# Patient Record
Sex: Female | Born: 1965 | Race: White | Hispanic: No | Marital: Single | Smoking: Never smoker
Health system: Southern US, Community
[De-identification: ages and names within clinical notes are randomized; demographics above are authoritative.]

## PROBLEM LIST (undated history)

## (undated) DIAGNOSIS — K5790 Diverticulosis of intestine, part unspecified, without perforation or abscess without bleeding: Secondary | ICD-10-CM

## (undated) DIAGNOSIS — Z820 Family history of epilepsy and other diseases of the nervous system: Secondary | ICD-10-CM

## (undated) DIAGNOSIS — I10 Essential (primary) hypertension: Secondary | ICD-10-CM

## (undated) DIAGNOSIS — E7800 Pure hypercholesterolemia, unspecified: Secondary | ICD-10-CM

## (undated) HISTORY — DX: Family history of epilepsy and other diseases of the nervous system: Z82.0

## (undated) HISTORY — PX: ABDOMINAL HYSTERECTOMY: SHX81

## (undated) HISTORY — PX: FOOT SURGERY: SHX648

---

## 2013-03-05 ENCOUNTER — Encounter: Payer: Self-pay | Admitting: Podiatrist

## 2013-03-05 ENCOUNTER — Ambulatory Visit (INDEPENDENT_AMBULATORY_CARE_PROVIDER_SITE_OTHER): Payer: BC Managed Care – PPO | Admitting: Podiatrist

## 2013-03-05 ENCOUNTER — Ambulatory Visit (INDEPENDENT_AMBULATORY_CARE_PROVIDER_SITE_OTHER): Payer: BC Managed Care – PPO

## 2013-03-05 VITALS — BP 140/96 | HR 88 | Resp 18

## 2013-03-05 DIAGNOSIS — M722 Plantar fascial fibromatosis: Secondary | ICD-10-CM

## 2013-03-05 MED ORDER — TRIAMCINOLONE ACETONIDE 40 MG/ML IJ SUSP
20.0000 mg | Freq: Once | INTRAMUSCULAR | Status: AC
  Administered 2013-03-05: 20 mg

## 2013-03-05 NOTE — Progress Notes (Signed)
   Subjective:    Patient ID: Alicia Case, female    DOB: 1965-06-11, 48 y.o.   MRN: [id number]  HPI My right heel has been acting up since sept 21, 2014 and I have rolled ice bottle and stretch and sore and tender and burns and worse after I sit and getting up in the morning    Review of Systems  HENT: Positive for sinus pressure.   Eyes: Positive for redness and itching.  Musculoskeletal: Positive for back pain and gait problem.  Allergic/Immunologic: Positive for environmental allergies.  Neurological: Positive for headaches.  All other systems reviewed and are negative.       Objective:   Physical Exam GENERAL APPEARANCE: Alert, conversant. Appropriately groomed. No acute distress.  VASCULAR: Pedal pulses palpable at 2/4 DP and PT bilateral.  Capillary refill time is immediate to all digits,  Proximal to distal cooling it warm to warm.  Digital hair growth is present bilateral  NEUROLOGIC: sensation is intact epicritically and protectively to 5.07 monofilament at 5/5 sites bilateral.  Light touch is intact bilateral, vibratory sensation intact bilateral, achilles tendon reflex is intact bilateral.  MUSCULOSKELETAL: acceptable muscle strength, tone and stability bilateral.  Pain on palpation plantar medial side of the right heel is present.  Pain at insertion of plantar fascia on medial calcaneal tubercle is elicited.  Mild discomfort in midsubstance region of plantar fascia is palpated. DERMATOLOGIC: skin color, texture, and turger are within normal limits.      xrays show inferior calcaneal spurring. Increased density along the plantar fascia is also present. Right foot Assessment & Plan:  Plantar fasciitis right Plan: Injected the right foot with Kenalog and Marcaine mixture in the area of maximal tenderness under sterile technique and without complication. Removable plantar fascial strapping was applied. She was given instructions for stretching and I will see her back in 3  weeks. A second injection may be necessary. Also will check her orthotic coverage benefits and notify her at the next visit as well.

## 2013-03-05 NOTE — Patient Instructions (Signed)

## 2013-03-05 NOTE — Progress Notes (Deleted)
Plant. Fasc-- inject-- taping-- 3 weeks back

## 2013-04-02 ENCOUNTER — Ambulatory Visit: Payer: BC Managed Care – PPO | Admitting: Podiatrist

## 2013-04-11 ENCOUNTER — Ambulatory Visit: Payer: BC Managed Care – PPO | Admitting: Podiatrist

## 2013-05-02 ENCOUNTER — Ambulatory Visit: Payer: BC Managed Care – PPO | Admitting: Podiatrist

## 2013-05-16 ENCOUNTER — Ambulatory Visit (INDEPENDENT_AMBULATORY_CARE_PROVIDER_SITE_OTHER): Payer: BC Managed Care – PPO | Admitting: Podiatrist

## 2013-05-16 ENCOUNTER — Encounter: Payer: Self-pay | Admitting: Podiatrist

## 2013-05-16 VITALS — BP 135/92 | HR 67 | Resp 18

## 2013-05-16 DIAGNOSIS — M722 Plantar fascial fibromatosis: Secondary | ICD-10-CM

## 2013-05-16 NOTE — Patient Instructions (Signed)

## 2013-05-16 NOTE — Progress Notes (Signed)
My right heel is starting to hurt again and was better before  Patient presents for follow up of plantar fasciitis right foot.  She relates it did feel better after the injection but it is now uncomfortble again.    Physical Exam  GENERAL APPEARANCE: Alert, conversant. Appropriately groomed. No acute distress.  VASCULAR: Pedal pulses palpable at 2/4 DP and PT bilateral. Capillary refill time is immediate to all digits, Proximal to distal cooling it warm to warm. Digital hair growth is present bilateral  NEUROLOGIC: sensation is intact epicritically and protectively to 5.07 monofilament at 5/5 sites bilateral. Light touch is intact bilateral, vibratory sensation intact bilateral, achilles tendon reflex is intact bilateral.  MUSCULOSKELETAL: acceptable muscle strength, tone and stability bilateral. Pain on palpation plantar medial side of the right heel is present. Pain at insertion of plantar fascia on medial calcaneal tubercle is elicited. Mild discomfort in midsubstance region of plantar fascia is palpated.  DERMATOLOGIC: skin color, texture, and turger are within normal limits.   Assessment & Plan:   Plantar fasciitis right  Plan: Injected the right foot with Kenalog and Marcaine mixture in the area of maximal tenderness under sterile technique and without complication for injection 2. Removable plantar fascial strapping was applied. Discussed the positive benefits of orthotics for further treatment of plantar fasciitis.

## 2013-06-06 ENCOUNTER — Other Ambulatory Visit: Payer: BC Managed Care – PPO

## 2013-08-04 ENCOUNTER — Ambulatory Visit (INDEPENDENT_AMBULATORY_CARE_PROVIDER_SITE_OTHER): Payer: BC Managed Care – PPO | Admitting: *Deleted

## 2013-08-04 DIAGNOSIS — M722 Plantar fascial fibromatosis: Secondary | ICD-10-CM

## 2013-08-04 NOTE — Patient Instructions (Signed)

## 2013-08-04 NOTE — Progress Notes (Signed)
   Subjective:    Patient ID: Alicia Case, female    DOB: 1965/05/10, 48 y.o.   MRN: [id number]  HPI  PICK UP ORTHOTICS AND GIVEN INSTRUCTION.    Review of Systems     Objective:   Physical Exam        Assessment & Plan:

## 2013-09-04 DIAGNOSIS — G43019 Migraine without aura, intractable, without status migrainosus: Secondary | ICD-10-CM | POA: Insufficient documentation

## 2013-09-04 DIAGNOSIS — I10 Essential (primary) hypertension: Secondary | ICD-10-CM | POA: Insufficient documentation

## 2013-09-04 DIAGNOSIS — F331 Major depressive disorder, recurrent, moderate: Secondary | ICD-10-CM | POA: Insufficient documentation

## 2013-09-18 DIAGNOSIS — E559 Vitamin D deficiency, unspecified: Secondary | ICD-10-CM | POA: Insufficient documentation

## 2014-07-13 DIAGNOSIS — F411 Generalized anxiety disorder: Secondary | ICD-10-CM | POA: Insufficient documentation

## 2014-07-13 DIAGNOSIS — Z23 Encounter for immunization: Secondary | ICD-10-CM | POA: Insufficient documentation

## 2014-12-01 ENCOUNTER — Ambulatory Visit (INDEPENDENT_AMBULATORY_CARE_PROVIDER_SITE_OTHER): Payer: Worker's Compensation | Admitting: Family Medicine

## 2014-12-01 ENCOUNTER — Encounter: Payer: Self-pay | Admitting: Family Medicine

## 2014-12-01 VITALS — BP 130/84 | HR 80 | Temp 97.6°F | Ht 65.0 in | Wt 193.0 lb

## 2014-12-01 DIAGNOSIS — S51851A Open bite of right forearm, initial encounter: Secondary | ICD-10-CM | POA: Diagnosis not present

## 2014-12-01 DIAGNOSIS — Z23 Encounter for immunization: Secondary | ICD-10-CM | POA: Diagnosis not present

## 2014-12-01 MED ORDER — CEPHALEXIN 500 MG PO CAPS: 500.0000 mg | ORAL_CAPSULE | Freq: Three times a day (TID) | ORAL | Status: DC

## 2014-12-01 MED ORDER — MUPIROCIN 2 % EX OINT: 1.0000 "application " | TOPICAL_OINTMENT | Freq: Two times a day (BID) | CUTANEOUS | Status: AC

## 2014-12-01 NOTE — Patient Instructions (Signed)
Take antibiotic as directed and until completed Wash thoroughly with warm soap and water Cleanse with peroxide Apply mupirocin ointment to the wound Call sooner if any redness occurs or if it appears to be moving up your arm Return to clinic in one week for follow-up
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
## 2014-12-08 ENCOUNTER — Encounter: Payer: Self-pay | Admitting: Family Medicine

## 2014-12-08 ENCOUNTER — Ambulatory Visit (INDEPENDENT_AMBULATORY_CARE_PROVIDER_SITE_OTHER): Payer: Worker's Compensation | Admitting: Family Medicine

## 2014-12-08 VITALS — BP 126/72 | HR 85 | Temp 98.8°F | Ht 65.0 in | Wt 193.0 lb

## 2014-12-08 DIAGNOSIS — R222 Localized swelling, mass and lump, trunk: Secondary | ICD-10-CM | POA: Diagnosis not present

## 2014-12-08 DIAGNOSIS — M79622 Pain in left upper arm: Secondary | ICD-10-CM | POA: Diagnosis not present

## 2014-12-08 DIAGNOSIS — M25512 Pain in left shoulder: Secondary | ICD-10-CM | POA: Diagnosis not present

## 2014-12-08 DIAGNOSIS — S51851D Open bite of right forearm, subsequent encounter: Secondary | ICD-10-CM | POA: Diagnosis not present

## 2014-12-08 DIAGNOSIS — R2230 Localized swelling, mass and lump, unspecified upper limb: Secondary | ICD-10-CM

## 2014-12-08 MED ORDER — DOXYCYCLINE HYCLATE 100 MG PO TABS: 100.0000 mg | ORAL_TABLET | Freq: Two times a day (BID) | ORAL | Status: DC
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
## 2014-12-08 NOTE — Patient Instructions (Signed)
Follow up in 2 weeks Start the Doxy 100 mg after completing the Keflex

## 2014-12-23 ENCOUNTER — Encounter: Payer: Self-pay | Admitting: Family Medicine

## 2014-12-23 ENCOUNTER — Ambulatory Visit (INDEPENDENT_AMBULATORY_CARE_PROVIDER_SITE_OTHER): Payer: Worker's Compensation | Admitting: Family Medicine

## 2014-12-23 VITALS — BP 126/83 | HR 78 | Temp 97.3°F | Ht 65.0 in | Wt 194.0 lb

## 2014-12-23 DIAGNOSIS — S51851D Open bite of right forearm, subsequent encounter: Secondary | ICD-10-CM | POA: Diagnosis not present
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
## 2015-08-31 DIAGNOSIS — E785 Hyperlipidemia, unspecified: Secondary | ICD-10-CM | POA: Insufficient documentation

## 2015-10-21 DIAGNOSIS — H6123 Impacted cerumen, bilateral: Secondary | ICD-10-CM | POA: Insufficient documentation

## 2015-10-21 DIAGNOSIS — J0100 Acute maxillary sinusitis, unspecified: Secondary | ICD-10-CM | POA: Insufficient documentation

## 2016-10-23 DIAGNOSIS — E669 Obesity, unspecified: Secondary | ICD-10-CM | POA: Insufficient documentation

## 2017-04-26 DIAGNOSIS — J302 Other seasonal allergic rhinitis: Secondary | ICD-10-CM | POA: Insufficient documentation

## 2017-04-26 DIAGNOSIS — Z1211 Encounter for screening for malignant neoplasm of colon: Secondary | ICD-10-CM | POA: Insufficient documentation

## 2017-05-15 DIAGNOSIS — H9012 Conductive hearing loss, unilateral, left ear, with unrestricted hearing on the contralateral side: Secondary | ICD-10-CM | POA: Insufficient documentation

## 2018-03-07 DIAGNOSIS — L989 Disorder of the skin and subcutaneous tissue, unspecified: Secondary | ICD-10-CM | POA: Insufficient documentation

## 2018-04-23 DIAGNOSIS — B354 Tinea corporis: Secondary | ICD-10-CM | POA: Insufficient documentation

## 2018-07-17 ENCOUNTER — Ambulatory Visit: Payer: BC Managed Care – PPO | Admitting: Podiatry

## 2018-07-17 ENCOUNTER — Other Ambulatory Visit: Payer: Self-pay | Admitting: Podiatry

## 2018-07-17 ENCOUNTER — Ambulatory Visit (INDEPENDENT_AMBULATORY_CARE_PROVIDER_SITE_OTHER): Payer: BC Managed Care – PPO

## 2018-07-17 ENCOUNTER — Encounter: Payer: Self-pay | Admitting: Podiatry

## 2018-07-17 ENCOUNTER — Other Ambulatory Visit: Payer: Self-pay

## 2018-07-17 VITALS — Temp 98.1°F

## 2018-07-17 DIAGNOSIS — M7752 Other enthesopathy of left foot: Secondary | ICD-10-CM | POA: Diagnosis not present

## 2018-07-17 DIAGNOSIS — M79672 Pain in left foot: Secondary | ICD-10-CM

## 2018-07-17 DIAGNOSIS — L600 Ingrowing nail: Secondary | ICD-10-CM | POA: Diagnosis not present

## 2018-07-17 NOTE — Patient Instructions (Signed)
Pre-Operative Instructions  Congratulations, you have decided to take an important step towards improving your quality of life.  You can be assured that the doctors and staff at [facility] will be with you every step of the way.  Here are some important things you should know:  1. Plan to be at the surgery center/hospital at least 1 (one) hour prior to your scheduled time, unless otherwise directed by the surgical center/hospital staff.  You must have a responsible adult accompany you, remain during the surgery and drive you home.  Make sure you have directions to the surgical center/hospital to ensure you arrive on time. 2. If you are having surgery at [facility] or [facility] hospitals, you will need a copy of your medical history and physical form from your family physician within one month prior to the date of surgery. We will give you a form for your primary physician to complete.  3. We make every effort to accommodate the date you request for surgery.  However, there are times where surgery dates or times have to be moved.  We will contact you as soon as possible if a change in schedule is required.   4. No aspirin/ibuprofen for one week before surgery.  If you are on aspirin, any non-steroidal anti-inflammatory medications (Mobic, Aleve, Ibuprofen) should not be taken seven (7) days prior to your surgery.  You make take Tylenol for pain prior to surgery.  5. Medications - If you are taking daily heart and blood pressure medications, seizure, reflux, allergy, asthma, anxiety, pain or diabetes medications, make sure you notify the surgery center/hospital before the day of surgery so they can tell you which medications you should take or avoid the day of surgery. 6. No food or drink after midnight the night before surgery unless directed otherwise by surgical center/hospital staff. 7. No alcoholic beverages 24-hours prior to surgery.  No smoking 24-hours prior or 24-hours after  surgery. 8. Wear loose pants or shorts. They should be loose enough to fit over bandages, boots, and casts. 9. Don't wear slip-on shoes. Sneakers are preferred. 10. Bring your boot with you to the surgery center/hospital.  Also bring crutches or a walker if your physician has prescribed it for you.  If you do not have this equipment, it will be provided for you after surgery. 11. If you have not been contacted by the surgery center/hospital by the day before your surgery, call to confirm the date and time of your surgery. 12. Leave-time from work may vary depending on the type of surgery you have.  Appropriate arrangements should be made prior to surgery with your employer. 13. Prescriptions will be provided immediately following surgery by your doctor.  Fill these as soon as possible after surgery and take the medication as directed. Pain medications will not be refilled on weekends and must be approved by the doctor. 14. Remove nail polish on the operative foot and avoid getting pedicures prior to surgery. 15. Wash the night before surgery.  The night before surgery wash the foot and leg well with water and the antibacterial soap provided. Be sure to pay special attention to beneath the toenails and in between the toes.  Wash for at least three (3) minutes. Rinse thoroughly with water and dry well with a towel.  Perform this wash unless told not to do so by your physician.  Enclosed: 1 Ice pack (please put in freezer the night before surgery)   1 Hibiclens skin cleaner     Pre-op instructions  If you have any questions regarding the instructions, please do not hesitate to call our office.  [place]: [address], [place], [place] [address] -- [phone]  [place]: [address] [place], [place] [address] -- [phone]  [place]: [address]  , [place] [address] -- [phone]  [place]: [address], [address], [place], [place] [address] -- [phone]  Website:  [email or web address] 

## 2018-07-18 NOTE — Progress Notes (Signed)
Subjective:   Patient ID: Alicia Case, female   DOB: 53 y.o.   MRN: [id number]   HPI Patient states she has a painful spur on top of her left foot that is been there for a long time and is become more discomforting over the last year.  Patient states that she is having more trouble with this and would like it removed and states that she is tried to wear wider shoes and other modalities without relief.  Does not smoke likes to be active   Review of Systems  All other systems reviewed and are negative.       Objective:  Physical Exam Vitals signs and nursing note reviewed.  Constitutional:      Appearance: She is well-developed.  Pulmonary:     Effort: Pulmonary effort is normal.  Musculoskeletal: Normal range of motion.  Skin:    General: Skin is warm.  Neurological:     Mental Status: She is alert.     Neurovascular status found to be intact muscle strength is adequate range of motion within normal limits.  Patient is found to have on the dorsum of the left first metatarsal is spur formation that appears to be related to the first metatarsal head and also appears to have movement in it and does have some slight redness associated with it.  Patient has good digital perfusion well oriented x3     Assessment:  Bone spur formation dorsal left that may be partially attached to the first metatarsal head or free-floating     Plan:  H&P reviewed condition and recommended smoothing of the first metatarsal head with reduction of the spur formation.  Patient wants surgery and is not interested in anything else and at this point I allowed her to read consent form going over all possible complications and the fact we may send this to pathology depending on what it looks like.  Patient understands is willing to accept risk of surgery and after review signed consent form and is encouraged to call with any questions concerns which may occur  X-ray indicates that there is a spur on the  dorsum of the left first metatarsal and it also appears to have a free-floating component to it

## 2018-07-23 ENCOUNTER — Telehealth: Payer: Self-pay | Admitting: *Deleted

## 2018-07-23 NOTE — Telephone Encounter (Signed)
I am calling to verify your surgery date.  You're supposed to be scheduled for July 28, is that correct?  "Yes, he told me the 28th."

## 2018-07-31 ENCOUNTER — Ambulatory Visit: Payer: BC Managed Care – PPO | Admitting: Podiatry

## 2018-08-12 ENCOUNTER — Telehealth: Payer: Self-pay | Admitting: *Deleted

## 2018-08-12 NOTE — Telephone Encounter (Signed)
DOS 07/28/2020[facility] [facility] BUNIONECTOMY LT FOOT - [id number]  BCBS: Effective Date - 01/16/2018 - 01/15/9998   In-Network    Max Per Benefit Period Year-to-Date Remaining  CoInsurance  20%   Deductible  $1250.00 $1250.00  Out-Of-Pocket  $4890.00 $4717.03   AMBULATORY SURGERY  In Network  [employer] Coinsurance  Not Applicable  81% per [place]

## 2018-08-13 ENCOUNTER — Encounter: Payer: Self-pay | Admitting: Podiatry

## 2018-08-13 DIAGNOSIS — M2012 Hallux valgus (acquired), left foot: Secondary | ICD-10-CM

## 2018-08-14 ENCOUNTER — Telehealth: Payer: Self-pay | Admitting: *Deleted

## 2018-08-14 NOTE — Telephone Encounter (Signed)
Called and left a message for the patient stating that I was calling to check on patient after having surgery yesterday with Dr Paulla Dolly and stated to call the  office at [phone]. Alicia Case

## 2018-08-19 ENCOUNTER — Encounter: Payer: Self-pay | Admitting: Podiatry

## 2018-08-19 ENCOUNTER — Other Ambulatory Visit: Payer: Self-pay

## 2018-08-19 ENCOUNTER — Ambulatory Visit (INDEPENDENT_AMBULATORY_CARE_PROVIDER_SITE_OTHER): Payer: Self-pay | Admitting: Podiatry

## 2018-08-19 ENCOUNTER — Ambulatory Visit (INDEPENDENT_AMBULATORY_CARE_PROVIDER_SITE_OTHER): Payer: BC Managed Care – PPO

## 2018-08-19 VITALS — Temp 98.0°F

## 2018-08-19 DIAGNOSIS — M7752 Other enthesopathy of left foot: Secondary | ICD-10-CM

## 2018-08-19 DIAGNOSIS — Z09 Encounter for follow-up examination after completed treatment for conditions other than malignant neoplasm: Secondary | ICD-10-CM

## 2018-08-19 NOTE — Progress Notes (Signed)
Subjective: Alicia Case is a 53 y.o. is seen today in office s/p Left foot [place]ride preformed on 08/13/2018 with Dr. Paulla Dolly.  She states that her pain is controlled and not taking any pain medication at this time.  She is been wearing a surgical shoe.  Denies any systemic complaints such as fevers, chills, nausea, vomiting. No calf pain, chest pain, shortness of breath.   Objective: General: No acute distress, AAOx3  DP/PT pulses palpable 2/4, CRT < 3 sec to all digits.  Protective sensation intact. Motor function intact.  LEFT foot: Incision is well coapted without any evidence of dehiscence and sutures are intact. There is no surrounding erythema, ascending cellulitis, fluctuance, crepitus, malodor, drainage/purulence. There is minimal edema around the surgical site. There is no pain along the surgical site.  No pain with MPJ range of motion, no crepitation no restrictions. No other areas of tenderness to bilateral lower extremities.  No other open lesions or pre-ulcerative lesions.  No pain with calf compression, swelling, warmth, erythema.   Assessment and Plan:  Status post left foot surgery, doing well with no complications   -Treatment options discussed including all alternatives, risks, and complications -X-rays were obtained today.  No evidence of acute fracture.  Adequate alignment of the 1st MTPJ.  -Antibiotic ointment and a bandage applied. Later this week she can start to shower but dry the incision well and keep antibiotic ointment and a bandage over the incision.  -Ice/elevation -Pain medication as needed- She has not been needing this.  -Monitor for any clinical signs or symptoms of infection and DVT/PE and directed to call the office immediately should any occur or go to the ER. -Follow-up as scheduled with Dr. Paulla Dolly or sooner if any problems arise. In the meantime, encouraged to call the office with any questions, concerns, change in symptoms.   *Dr. Paulla Dolly told her that he  was to do ingrown toenail procedure today however he was not in the office.  We will reappoint her to have this done at her request  Celesta Gentile, DPM

## 2018-08-25 ENCOUNTER — Emergency Department (HOSPITAL_BASED_OUTPATIENT_CLINIC_OR_DEPARTMENT_OTHER): Payer: BC Managed Care – PPO

## 2018-08-25 ENCOUNTER — Emergency Department (HOSPITAL_COMMUNITY): Payer: BC Managed Care – PPO | Admitting: Certified Registered Nurse Anesthetist

## 2018-08-25 ENCOUNTER — Observation Stay (HOSPITAL_BASED_OUTPATIENT_CLINIC_OR_DEPARTMENT_OTHER)
Admission: EM | Admit: 2018-08-25 | Discharge: 2018-08-27 | Disposition: A | Discharge status: Home / Self Care | Payer: BC Managed Care – PPO | Attending: Surgery | Admitting: Surgery

## 2018-08-25 ENCOUNTER — Encounter (HOSPITAL_COMMUNITY)
Admission: EM | Disposition: A | Discharge status: Home / Self Care | Payer: Self-pay | Source: Home / Self Care | Attending: Emergency Medicine

## 2018-08-25 ENCOUNTER — Encounter (HOSPITAL_BASED_OUTPATIENT_CLINIC_OR_DEPARTMENT_OTHER): Payer: Self-pay | Admitting: Emergency Medicine

## 2018-08-25 ENCOUNTER — Other Ambulatory Visit: Payer: Self-pay

## 2018-08-25 DIAGNOSIS — Z8249 Family history of ischemic heart disease and other diseases of the circulatory system: Secondary | ICD-10-CM | POA: Insufficient documentation

## 2018-08-25 DIAGNOSIS — Z90710 Acquired absence of both cervix and uterus: Secondary | ICD-10-CM | POA: Diagnosis not present

## 2018-08-25 DIAGNOSIS — E7800 Pure hypercholesterolemia, unspecified: Secondary | ICD-10-CM | POA: Diagnosis not present

## 2018-08-25 DIAGNOSIS — E669 Obesity, unspecified: Secondary | ICD-10-CM | POA: Insufficient documentation

## 2018-08-25 DIAGNOSIS — Z79899 Other long term (current) drug therapy: Secondary | ICD-10-CM | POA: Diagnosis not present

## 2018-08-25 DIAGNOSIS — R51 Headache: Secondary | ICD-10-CM | POA: Diagnosis not present

## 2018-08-25 DIAGNOSIS — Z885 Allergy status to narcotic agent status: Secondary | ICD-10-CM | POA: Diagnosis not present

## 2018-08-25 DIAGNOSIS — E785 Hyperlipidemia, unspecified: Secondary | ICD-10-CM | POA: Diagnosis not present

## 2018-08-25 DIAGNOSIS — Z888 Allergy status to other drugs, medicaments and biological substances status: Secondary | ICD-10-CM | POA: Insufficient documentation

## 2018-08-25 DIAGNOSIS — Z880 Allergy status to penicillin: Secondary | ICD-10-CM | POA: Diagnosis not present

## 2018-08-25 DIAGNOSIS — H902 Conductive hearing loss, unspecified: Secondary | ICD-10-CM | POA: Diagnosis not present

## 2018-08-25 DIAGNOSIS — K3580 Unspecified acute appendicitis: Principal | ICD-10-CM | POA: Diagnosis present

## 2018-08-25 DIAGNOSIS — K5790 Diverticulosis of intestine, part unspecified, without perforation or abscess without bleeding: Secondary | ICD-10-CM | POA: Diagnosis not present

## 2018-08-25 DIAGNOSIS — Z20828 Contact with and (suspected) exposure to other viral communicable diseases: Secondary | ICD-10-CM | POA: Diagnosis not present

## 2018-08-25 DIAGNOSIS — K37 Unspecified appendicitis: Secondary | ICD-10-CM | POA: Diagnosis present

## 2018-08-25 DIAGNOSIS — Z6835 Body mass index (BMI) 35.0-35.9, adult: Secondary | ICD-10-CM | POA: Insufficient documentation

## 2018-08-25 DIAGNOSIS — F419 Anxiety disorder, unspecified: Secondary | ICD-10-CM | POA: Diagnosis not present

## 2018-08-25 DIAGNOSIS — K3589 Other acute appendicitis without perforation or gangrene: Secondary | ICD-10-CM | POA: Diagnosis not present

## 2018-08-25 DIAGNOSIS — F329 Major depressive disorder, single episode, unspecified: Secondary | ICD-10-CM | POA: Insufficient documentation

## 2018-08-25 DIAGNOSIS — E559 Vitamin D deficiency, unspecified: Secondary | ICD-10-CM | POA: Insufficient documentation

## 2018-08-25 DIAGNOSIS — I10 Essential (primary) hypertension: Secondary | ICD-10-CM | POA: Diagnosis not present

## 2018-08-25 HISTORY — DX: Pure hypercholesterolemia, unspecified: E78.00

## 2018-08-25 HISTORY — DX: Diverticulosis of intestine, part unspecified, without perforation or abscess without bleeding: K57.90

## 2018-08-25 HISTORY — DX: Essential (primary) hypertension: I10

## 2018-08-25 HISTORY — PX: LAPAROSCOPIC APPENDECTOMY: SHX408

## 2018-08-25 LAB — CBC WITH DIFFERENTIAL/PLATELET
Abs Immature Granulocytes: 0.06 10*3/uL (ref 0.00–0.07)
Basophils Absolute: 0 10*3/uL (ref 0.0–0.1)
Basophils Relative: 0 %
Eosinophils Absolute: 0.1 10*3/uL (ref 0.0–0.5)
Eosinophils Relative: 0 %
HCT: 40.7 % (ref 36.0–46.0)
Hemoglobin: 13.4 g/dL (ref 12.0–15.0)
Immature Granulocytes: 0 %
Lymphocytes Relative: 10 %
Lymphs Abs: 1.7 10*3/uL (ref 0.7–4.0)
MCH: 29.8 pg (ref 26.0–34.0)
MCHC: 32.9 g/dL (ref 30.0–36.0)
MCV: 90.4 fL (ref 80.0–100.0)
Monocytes Absolute: 1 10*3/uL (ref 0.1–1.0)
Monocytes Relative: 6 %
Neutro Abs: 13.8 10*3/uL — ABNORMAL HIGH (ref 1.7–7.7)
Neutrophils Relative %: 84 %
Platelets: 311 10*3/uL (ref 150–400)
RBC: 4.5 MIL/uL (ref 3.87–5.11)
RDW: 13.1 % (ref 11.5–15.5)
WBC: 16.7 10*3/uL — ABNORMAL HIGH (ref 4.0–10.5)
nRBC: 0 % (ref 0.0–0.2)

## 2018-08-25 LAB — COMPREHENSIVE METABOLIC PANEL
ALT: 19 U/L (ref 0–44)
AST: 20 U/L (ref 15–41)
Albumin: 3.9 g/dL (ref 3.5–5.0)
Alkaline Phosphatase: 85 U/L (ref 38–126)
Anion gap: 15 (ref 5–15)
BUN: 10 mg/dL (ref 6–20)
CO2: 23 mmol/L (ref 22–32)
Calcium: 9.3 mg/dL (ref 8.9–10.3)
Chloride: 99 mmol/L (ref 98–111)
Creatinine, Ser: 0.84 mg/dL (ref 0.44–1.00)
GFR calc Af Amer: >60 mL/min (ref >60)
GFR calc non Af Amer: >60 mL/min (ref >60)
Glucose, Bld: 166 mg/dL — ABNORMAL HIGH (ref 70–99)
Potassium: 3.3 mmol/L — ABNORMAL LOW (ref 3.5–5.1)
Sodium: 137 mmol/L (ref 135–145)
Total Bilirubin: 1.2 mg/dL (ref 0.3–1.2)
Total Protein: 7 g/dL (ref 6.5–8.1)

## 2018-08-25 LAB — SARS CORONAVIRUS 2 BY RT PCR (HOSPITAL ORDER, PERFORMED IN ~~LOC~~ HOSPITAL LAB): SARS Coronavirus 2: NEGATIVE

## 2018-08-25 LAB — URINALYSIS, ROUTINE W REFLEX MICROSCOPIC
Bilirubin Urine: NEGATIVE
Glucose, UA: NEGATIVE mg/dL
Hgb urine dipstick: NEGATIVE
Ketones, ur: NEGATIVE mg/dL
Leukocytes,Ua: NEGATIVE
Nitrite: NEGATIVE
Protein, ur: NEGATIVE mg/dL
Specific Gravity, Urine: <1.005 — ABNORMAL LOW (ref 1.005–1.030)
pH: 6.5 (ref 5.0–8.0)

## 2018-08-25 LAB — TROPONIN I (HIGH SENSITIVITY): Troponin I (High Sensitivity): 4 ng/L (ref <18)

## 2018-08-25 SURGERY — APPENDECTOMY, LAPAROSCOPIC
Anesthesia: General | Site: Abdomen

## 2018-08-25 MED ORDER — BUPIVACAINE-EPINEPHRINE 0.25% -1:200000 IJ SOLN
INTRAMUSCULAR | Status: DC | PRN
  Administered 2018-08-25: 30 mL

## 2018-08-25 MED ORDER — ACETAMINOPHEN 10 MG/ML IV SOLN: 1000.0000 mg | Freq: Once | INTRAVENOUS | Status: DC | PRN

## 2018-08-25 MED ORDER — SUGAMMADEX SODIUM 200 MG/2ML IV SOLN
INTRAVENOUS | Status: DC | PRN
  Administered 2018-08-25: 200 mg via INTRAVENOUS

## 2018-08-25 MED ORDER — FENTANYL CITRATE (PF) 100 MCG/2ML IJ SOLN
INTRAMUSCULAR | Status: DC | PRN
  Administered 2018-08-25 (×2): 50 ug via INTRAVENOUS
  Administered 2018-08-25: 100 ug via INTRAVENOUS

## 2018-08-25 MED ORDER — FENTANYL CITRATE (PF) 100 MCG/2ML IJ SOLN
100.0000 ug | Freq: Once | INTRAMUSCULAR | Status: AC
  Administered 2018-08-25: 100 ug via INTRAVENOUS
  Filled 2018-08-25: qty 2

## 2018-08-25 MED ORDER — DIPHENHYDRAMINE HCL 50 MG/ML IJ SOLN
12.5000 mg | Freq: Once | INTRAMUSCULAR | Status: AC
  Administered 2018-08-25: 10:00:00 12.5 mg via INTRAVENOUS
  Filled 2018-08-25: qty 1

## 2018-08-25 MED ORDER — PHENYLEPHRINE 40 MCG/ML (10ML) SYRINGE FOR IV PUSH (FOR BLOOD PRESSURE SUPPORT)
PREFILLED_SYRINGE | INTRAVENOUS | Status: DC | PRN
  Administered 2018-08-25: 80 ug via INTRAVENOUS

## 2018-08-25 MED ORDER — FENTANYL CITRATE (PF) 100 MCG/2ML IJ SOLN
100.0000 ug | Freq: Once | INTRAMUSCULAR | Status: AC
  Administered 2018-08-25: 10:00:00 100 ug via INTRAVENOUS
  Filled 2018-08-25: qty 2

## 2018-08-25 MED ORDER — METRONIDAZOLE IN NACL 5-0.79 MG/ML-% IV SOLN
500.0000 mg | Freq: Three times a day (TID) | INTRAVENOUS | Status: DC
  Administered 2018-08-25 – 2018-08-26 (×3): 500 mg via INTRAVENOUS
  Filled 2018-08-25 (×3): qty 100

## 2018-08-25 MED ORDER — PHENYLEPHRINE 40 MCG/ML (10ML) SYRINGE FOR IV PUSH (FOR BLOOD PRESSURE SUPPORT)
PREFILLED_SYRINGE | INTRAVENOUS | Status: AC
  Filled 2018-08-25: qty 10

## 2018-08-25 MED ORDER — MORPHINE SULFATE (PF) 2 MG/ML IV SOLN: 1.0000 mg | INTRAVENOUS | Status: DC | PRN

## 2018-08-25 MED ORDER — MIDAZOLAM HCL 5 MG/5ML IJ SOLN
INTRAMUSCULAR | Status: DC | PRN
  Administered 2018-08-25: 2 mg via INTRAVENOUS

## 2018-08-25 MED ORDER — SODIUM CHLORIDE 0.9 % IV SOLN
Freq: Once | INTRAVENOUS | Status: AC
  Administered 2018-08-25: 08:00:00 via INTRAVENOUS

## 2018-08-25 MED ORDER — HYDROMORPHONE HCL 1 MG/ML IJ SOLN: 0.2500 mg | INTRAMUSCULAR | Status: DC | PRN

## 2018-08-25 MED ORDER — 0.9 % SODIUM CHLORIDE (POUR BTL) OPTIME
TOPICAL | Status: DC | PRN
  Administered 2018-08-25: 1000 mL

## 2018-08-25 MED ORDER — ONDANSETRON HCL 4 MG/2ML IJ SOLN
4.0000 mg | Freq: Once | INTRAMUSCULAR | Status: AC
  Administered 2018-08-25: 08:00:00 4 mg via INTRAVENOUS
  Filled 2018-08-25: qty 2

## 2018-08-25 MED ORDER — PROPOFOL 10 MG/ML IV BOLUS
INTRAVENOUS | Status: DC | PRN
  Administered 2018-08-25: 170 mg via INTRAVENOUS

## 2018-08-25 MED ORDER — ENOXAPARIN SODIUM 40 MG/0.4ML ~~LOC~~ SOLN
40.0000 mg | SUBCUTANEOUS | Status: DC
  Administered 2018-08-26: 08:00:00 40 mg via SUBCUTANEOUS
  Filled 2018-08-25: qty 0.4

## 2018-08-25 MED ORDER — ROCURONIUM BROMIDE 10 MG/ML (PF) SYRINGE
PREFILLED_SYRINGE | INTRAVENOUS | Status: DC | PRN
  Administered 2018-08-25: 50 mg via INTRAVENOUS

## 2018-08-25 MED ORDER — METRONIDAZOLE IN NACL 5-0.79 MG/ML-% IV SOLN
500.0000 mg | Freq: Once | INTRAVENOUS | Status: DC
  Filled 2018-08-25: qty 100

## 2018-08-25 MED ORDER — DEXAMETHASONE SODIUM PHOSPHATE 4 MG/ML IJ SOLN
INTRAMUSCULAR | Status: DC | PRN
  Administered 2018-08-25: 10 mg via INTRAVENOUS

## 2018-08-25 MED ORDER — LACTATED RINGERS IV SOLN
INTRAVENOUS | Status: DC | PRN
  Administered 2018-08-25: 12:00:00 via INTRAVENOUS

## 2018-08-25 MED ORDER — FENTANYL CITRATE (PF) 100 MCG/2ML IJ SOLN
INTRAMUSCULAR | Status: AC
  Filled 2018-08-25: qty 2

## 2018-08-25 MED ORDER — PROPRANOLOL HCL ER 60 MG PO CP24
60.0000 mg | ORAL_CAPSULE | Freq: Every day | ORAL | Status: DC
  Administered 2018-08-25 – 2018-08-26 (×2): 60 mg via ORAL
  Filled 2018-08-25 (×3): qty 1

## 2018-08-25 MED ORDER — PROMETHAZINE HCL 25 MG/ML IJ SOLN: 6.2500 mg | INTRAMUSCULAR | Status: DC | PRN

## 2018-08-25 MED ORDER — LACTATED RINGERS IV SOLN
INTRAVENOUS | Status: AC | PRN
  Administered 2018-08-25: 1000 mL

## 2018-08-25 MED ORDER — SUCCINYLCHOLINE CHLORIDE 200 MG/10ML IV SOSY
PREFILLED_SYRINGE | INTRAVENOUS | Status: DC | PRN
  Administered 2018-08-25: 100 mg via INTRAVENOUS

## 2018-08-25 MED ORDER — MIDAZOLAM HCL 2 MG/2ML IJ SOLN
INTRAMUSCULAR | Status: AC
  Filled 2018-08-25: qty 2

## 2018-08-25 MED ORDER — IOHEXOL 300 MG/ML  SOLN
100.0000 mL | Freq: Once | INTRAMUSCULAR | Status: AC | PRN
  Administered 2018-08-25: 09:00:00 100 mL via INTRAVENOUS

## 2018-08-25 MED ORDER — SUMATRIPTAN SUCCINATE 50 MG PO TABS
100.0000 mg | ORAL_TABLET | ORAL | Status: DC | PRN
  Filled 2018-08-25: qty 2

## 2018-08-25 MED ORDER — BUPIVACAINE-EPINEPHRINE (PF) 0.25% -1:200000 IJ SOLN
INTRAMUSCULAR | Status: AC
  Filled 2018-08-25: qty 30

## 2018-08-25 MED ORDER — HYDROCODONE-ACETAMINOPHEN 5-325 MG PO TABS
1.0000 | ORAL_TABLET | ORAL | Status: DC | PRN
  Filled 2018-08-25: qty 2

## 2018-08-25 MED ORDER — TRAMADOL HCL 50 MG PO TABS
50.0000 mg | ORAL_TABLET | Freq: Four times a day (QID) | ORAL | Status: DC | PRN
  Administered 2018-08-25 – 2018-08-26 (×3): 50 mg via ORAL
  Filled 2018-08-25 (×4): qty 1

## 2018-08-25 MED ORDER — LIDOCAINE 2% (20 MG/ML) 5 ML SYRINGE
INTRAMUSCULAR | Status: DC | PRN
  Administered 2018-08-25: 60 mg via INTRAVENOUS

## 2018-08-25 MED ORDER — CIPROFLOXACIN IN D5W 400 MG/200ML IV SOLN
400.0000 mg | Freq: Two times a day (BID) | INTRAVENOUS | Status: DC
  Administered 2018-08-25 – 2018-08-26 (×2): 400 mg via INTRAVENOUS
  Filled 2018-08-25 (×2): qty 200

## 2018-08-25 MED ORDER — METOCLOPRAMIDE HCL 5 MG/ML IJ SOLN
10.0000 mg | Freq: Once | INTRAMUSCULAR | Status: AC
  Administered 2018-08-25: 10 mg via INTRAVENOUS
  Filled 2018-08-25: qty 2

## 2018-08-25 MED ORDER — ONDANSETRON HCL 4 MG/2ML IJ SOLN
4.0000 mg | Freq: Four times a day (QID) | INTRAMUSCULAR | Status: DC | PRN
  Administered 2018-08-26 (×2): 4 mg via INTRAVENOUS
  Filled 2018-08-25 (×3): qty 2

## 2018-08-25 MED ORDER — ONDANSETRON HCL 4 MG/2ML IJ SOLN
INTRAMUSCULAR | Status: DC | PRN
  Administered 2018-08-25: 4 mg via INTRAVENOUS

## 2018-08-25 MED ORDER — ONDANSETRON HCL 4 MG/2ML IJ SOLN
INTRAMUSCULAR | Status: AC
  Filled 2018-08-25: qty 2

## 2018-08-25 MED ORDER — HYDROCODONE-ACETAMINOPHEN 7.5-325 MG PO TABS: 1.0000 | ORAL_TABLET | Freq: Once | ORAL | Status: DC | PRN

## 2018-08-25 MED ORDER — CIPROFLOXACIN IN D5W 400 MG/200ML IV SOLN
400.0000 mg | Freq: Once | INTRAVENOUS | Status: AC
  Administered 2018-08-25: 10:00:00 400 mg via INTRAVENOUS
  Filled 2018-08-25: qty 200

## 2018-08-25 MED ORDER — DEXAMETHASONE SODIUM PHOSPHATE 10 MG/ML IJ SOLN
INTRAMUSCULAR | Status: AC
  Filled 2018-08-25: qty 1

## 2018-08-25 MED ORDER — PROPOFOL 10 MG/ML IV BOLUS
INTRAVENOUS | Status: AC
  Filled 2018-08-25: qty 20

## 2018-08-25 MED ORDER — ONDANSETRON 4 MG PO TBDP: 4.0000 mg | ORAL_TABLET | Freq: Four times a day (QID) | ORAL | Status: DC | PRN

## 2018-08-25 MED ORDER — KCL IN DEXTROSE-NACL 20-5-0.45 MEQ/L-%-% IV SOLN
INTRAVENOUS | Status: DC
  Administered 2018-08-25 – 2018-08-27 (×2): via INTRAVENOUS
  Filled 2018-08-25 (×3): qty 1000

## 2018-08-25 SURGICAL SUPPLY — 37 items
APPLIER CLIP ROT 10 11.4 M/L (STAPLE)
BENZOIN TINCTURE PRP APPL 2/3 (GAUZE/BANDAGES/DRESSINGS) IMPLANT
CABLE HIGH FREQUENCY MONO STRZ (ELECTRODE) IMPLANT
CHLORAPREP W/TINT 26 (MISCELLANEOUS) ×2 IMPLANT
CLIP APPLIE ROT 10 11.4 M/L (STAPLE) IMPLANT
COVER SURGICAL LIGHT HANDLE (MISCELLANEOUS) ×2 IMPLANT
COVER WAND RF STERILE (DRAPES) IMPLANT
CUTTER FLEX LINEAR 45M (STAPLE) ×4 IMPLANT
DECANTER SPIKE VIAL GLASS SM (MISCELLANEOUS) ×2 IMPLANT
DERMABOND ADVANCED (GAUZE/BANDAGES/DRESSINGS) ×1
DERMABOND ADVANCED .7 DNX12 (GAUZE/BANDAGES/DRESSINGS) ×1 IMPLANT
ELECT REM PT RETURN 15FT ADLT (MISCELLANEOUS) ×2 IMPLANT
ENDOLOOP SUT PDS II  0 18 (SUTURE)
ENDOLOOP SUT PDS II 0 18 (SUTURE) IMPLANT
GLOVE SURG SYN 7.5  E (GLOVE) ×1
GLOVE SURG SYN 7.5 E (GLOVE) ×1 IMPLANT
GOWN STRL REUS W/TWL XL LVL3 (GOWN DISPOSABLE) ×4 IMPLANT
KIT BASIN OR (CUSTOM PROCEDURE TRAY) ×2 IMPLANT
KIT TURNOVER KIT A (KITS) IMPLANT
POUCH RETRIEVAL ECOSAC 10 (ENDOMECHANICALS) ×1 IMPLANT
POUCH RETRIEVAL ECOSAC 10MM (ENDOMECHANICALS) ×1
POUCH SPECIMEN RETRIEVAL 10MM (ENDOMECHANICALS) IMPLANT
RELOAD 45 VASCULAR/THIN (ENDOMECHANICALS) IMPLANT
RELOAD STAPLE TA45 3.5 REG BLU (ENDOMECHANICALS) ×2 IMPLANT
SCISSORS LAP 5X35 DISP (ENDOMECHANICALS) ×2 IMPLANT
SET IRRIG TUBING LAPAROSCOPIC (IRRIGATION / IRRIGATOR) ×2 IMPLANT
SET TUBE SMOKE EVAC HIGH FLOW (TUBING) ×2 IMPLANT
SHEARS HARMONIC ACE PLUS 36CM (ENDOMECHANICALS) ×2 IMPLANT
SLEEVE XCEL OPT CAN 5 100 (ENDOMECHANICALS) ×2 IMPLANT
STRIP CLOSURE SKIN 1/2X4 (GAUZE/BANDAGES/DRESSINGS) IMPLANT
SUT MNCRL AB 4-0 PS2 18 (SUTURE) ×2 IMPLANT
SUT VIC AB 2-0 SH 18 (SUTURE) IMPLANT
TOWEL OR 17X26 10 PK STRL BLUE (TOWEL DISPOSABLE) ×2 IMPLANT
TOWEL OR NON WOVEN STRL DISP B (DISPOSABLE) ×2 IMPLANT
TRAY LAPAROSCOPIC (CUSTOM PROCEDURE TRAY) ×2 IMPLANT
TROCAR BLADELESS OPT 5 100 (ENDOMECHANICALS) ×2 IMPLANT
TROCAR XCEL BLUNT TIP 100MML (ENDOMECHANICALS) ×2 IMPLANT

## 2018-08-25 NOTE — ED Triage Notes (Signed)
Has had abd pain since eating at [facility] on Friday. Pain has gotten worse , "ripping pain" constant, slight nausea . Had 1 loose stool on Friday

## 2018-08-25 NOTE — Transfer of Care (Signed)
Immediate Anesthesia Transfer of Care Note  Patient: Alicia Case  Procedure(s) Performed: APPENDECTOMY LAPAROSCOPIC (N/A Abdomen)  Patient Location: PACU  Anesthesia Type:General  Level of Consciousness: awake and patient cooperative  Airway & Oxygen Therapy: Patient Spontanous Breathing and Patient connected to face mask  Post-op Assessment: Report given to RN and Post -op Vital signs reviewed and stable  Post vital signs: Reviewed and stable  Last Vitals:  Vitals Value Taken Time  BP    Temp    Pulse 92 08/25/18 1412  Resp 16 08/25/18 1412  SpO2 100 % 08/25/18 1412  Vitals shown include unvalidated device data.  Last Pain:  Vitals:   08/25/18 1136  TempSrc: Oral  PainSc:          Complications: No apparent anesthesia complications

## 2018-08-25 NOTE — Anesthesia Procedure Notes (Signed)
Procedure Name: Intubation Date/Time: 08/25/2018 12:59 PM Performed by: Claudia Desanctis, CRNA Pre-anesthesia Checklist: Patient identified, Emergency Drugs available, Suction available and Patient being monitored Patient Re-evaluated:Patient Re-evaluated prior to induction Oxygen Delivery Method: Circle system utilized Preoxygenation: Pre-oxygenation with 100% oxygen Induction Type: IV induction, Cricoid Pressure applied and Rapid sequence Laryngoscope Size: 2 and Miller Grade View: Grade I Tube type: Oral Tube size: 7.5 mm Number of attempts: 1 Airway Equipment and Method: Stylet Placement Confirmation: ETT inserted through vocal cords under direct vision,  positive ETCO2 and breath sounds checked- equal and bilateral Secured at: 8 cm Tube secured with: Tape Dental Injury: Teeth and Oropharynx as per pre-operative assessment

## 2018-08-25 NOTE — ED Notes (Signed)
Consult called via carelink, spoke w/ Bertram Millard

## 2018-08-25 NOTE — ED Notes (Signed)
Consent at bedside, belongings have been bagged and tagged with patient label, CHG bath has been performed and patient placed in new gown.

## 2018-08-25 NOTE — Op Note (Signed)
Re:   Alicia Case DOB:   August 03, 1965 MRN:   [id number]                   FACILITY:  [facility]  DATE OF PROCEDURE: 08/25/2018                              OPERATIVE REPORT  PREOPERATIVE DIAGNOSIS:  Appendicitis  POSTOPERATIVE DIAGNOSIS:  Acute purulent appendicitis.  PROCEDURE:  Laparoscopic appendectomy.  SURGEON:  Fenton Malling. Lucia Gaskins, MD  ASSISTANT:  No first assistant.  ANESTHESIA:  General endotracheal.  Anesthesiologist: Barnet Glasgow, MD CRNA: Claudia Desanctis, CRNA; Mitzie Na, CRNA  ASA:  2E  ESTIMATED BLOOD LOSS:  Minimal.  DRAINS: none   SPECIMEN:   Appendix  COUNTS CORRECT:  YES  INDICATIONS FOR PROCEDURE: Alicia Case is a 53 y.o. (DOB: [birth year]) white female whose primary care doctor is Hemberg, Karie Schwalbe, NP and comes to the OR for an appendectomy.  This is an emergency operation.  I discussed with the patient, the indications and potential complications of appendiceal surgery.  The potential complications include, but are not limited to, bleeding, open surgery, bowel resection, and the possibility of another diagnosis.  OPERATIVE NOTE:  The patient underwent a general endotracheal anesthetic as supervised by Anesthesiologist: Barnet Glasgow, MD CRNA: Claudia Desanctis, CRNA; Mitzie Na, CRNA, General, in [place] room #1.  The patient was given Cipro and Flagyl prior to the beginning of the procedure and the abdomen was prepped with ChloraPrep.   A time-out was held and surgical checklist run.  An infraumbilical incision was made with sharp dissection carried down to the abdominal cavity.  An 12 mm Hasson trocar was inserted through the infraumbilical incision and into the peritoneal cavity.  A 30 degree 5 mm laparoscope was inserted through a 12 mm Hasson trocar and the Hasson trocar secured with a 0 Vicryl suture.  I placed a 5 mm trocar in the right upper quadrant and a 5 mm torcar in left lower quadrant and did abdominal exploration.     The right and left lobes of liver unremarkable.  Stomach was unremarkable.  The pelvic organs were unremarkable.  I saw no other intra-abdominal abnormality.  She did has some adhesions of omentum to the anterior abdominal wall.  I spent less than 5 minutes taking these adhesions down.  The patient had appendicitis with the appendix located underneath the small bowel at the pelvic brim in the midline.  The appendix was purulent, but not ruptured.  The mesentery of the appendix was divided with a Harmonic scalpel.  I got to the base of the appendix.  I then used a blue load 45 mm Ethicon Endo-GIA stapler and fired this across the base of the appendix.  I placed the appendix in Eco Sac bag and delivered the bag through the umbilical incision.  I over sewed the appendiceal stump with a 2-0 vicryl because the staple line was bleeding. I irrigated the abdomen with 700 cc of saline.  After irrigating the abdomen, I then removed the trocars, in turn.  The umbilical port fascia was closed with 0 Vicryl suture.   I closed the skin each site with a 4-0 Monocryl suture and painted the wounds with DermaBond.  I then injected a total of 30 mL of 0.25% Marcaine at the incisions.  Sponge and needle count were correct at the end of the case.  The patient was transferred to the recovery room in good condition.  The patient tolerated the procedure well and it depends on the patient's post op clinical course as to when the patient could be discharged.   Alphonsa Overall, MD, [facility] Pager: [phone] Office phone:  [phone]

## 2018-08-25 NOTE — ED Notes (Signed)
Remains NPO.

## 2018-08-25 NOTE — ED Notes (Signed)
Patient has verbally informed RN that patient does not want any valuables locked, and that patient wants valuables to go with patient like non-valuables are transported when patients go to OR.

## 2018-08-25 NOTE — ED Notes (Signed)
Patient transported to CT 

## 2018-08-25 NOTE — ED Notes (Signed)
carelink called again ref surgery consult, spoke with Ruby

## 2018-08-25 NOTE — ED Provider Notes (Signed)
[facility] EMERGENCY DEPARTMENT Provider Note   CSN: [id number] Arrival date & time: 08/25/18  0711     History   Chief Complaint Chief Complaint  Patient presents with  . Abdominal Pain    HPI Alicia Case is a 53 y.o. female.     The history is provided by the patient.  Abdominal Pain Pain location:  RLQ and LLQ Pain quality: cramping   Pain radiates to:  Does not radiate Pain severity:  Severe Onset quality:  Gradual Duration:  3 days Timing:  Intermittent Progression:  Worsening Chronicity:  New Context: eating   Context comment:  Ate at [employer] and it did not agree with her.   Relieved by:  Nothing Worsened by:  Nothing Ineffective treatments:  None tried Associated symptoms: diarrhea, fever and nausea   Associated symptoms: no belching, no chest pain, no chills, no constipation, no cough, no dysuria, no shortness of breath, no sore throat, no vaginal bleeding, no vaginal discharge and no vomiting   Risk factors: not pregnant     Past Medical History:  Diagnosis Date  . Diverticulosis   . FHx: migraine headaches   . High cholesterol   . Hypertension     Patient Active Problem List   Diagnosis Date Noted  . Tinea corporis 04/23/2018  . Skin lesion of left leg 03/07/2018  . Conductive hearing loss of left ear 05/15/2017  . Encounter for screening colonoscopy 04/26/2017  . Seasonal allergies 04/26/2017  . Obesity (BMI 30-39.9) 10/23/2016  . Acute maxillary sinusitis 10/21/2015  . Bilateral impacted cerumen 10/21/2015  . Dyslipidemia 08/31/2015  . Anxiety, generalized 07/13/2014  . Need for vaccination 07/13/2014  . Vitamin D deficiency 09/18/2013  . Essential hypertension 09/04/2013  . Intractable migraine without aura and without status migrainosus 09/04/2013  . Major depressive disorder, recurrent episode, moderate ([place]) 09/04/2013    Past Surgical History:  Procedure Laterality Date  . ABDOMINAL HYSTERECTOMY    . FOOT  SURGERY Left      OB History   No obstetric history on file.      Home Medications    Prior to Admission medications   Medication Sig Start Date End Date Taking? Authorizing Provider  cetirizine (ZYRTEC) 5 MG tablet Take 5 mg by mouth daily.   Yes [provider]  hydrochlorothiazide (HYDRODIURIL) 25 MG tablet Take 25 mg by mouth. 03/25/14  Yes [provider]  propranolol ER (INDERAL LA) 60 MG 24 hr capsule TAKE 1 CAPSULE BY MOUTH EVERY DAY 08/02/16  Yes [provider]  simvastatin (ZOCOR) 20 MG tablet TAKE 1 TABLET BY MOUTH EVERYDAY AT BEDTIME 08/02/16  Yes [provider]  SUMAtriptan (IMITREX) 100 MG tablet  01/17/13  Yes [provider]  calcium-vitamin D (OSCAL WITH D) 250-125 MG-UNIT tablet Take 1 tablet by mouth.    [provider]  citalopram (CELEXA) 20 MG tablet Take 20 mg by mouth. 07/13/14 07/13/15  [provider]  fluconazole (DIFLUCAN) 150 MG tablet  04/23/18   [provider]  levofloxacin (LEVAQUIN) 500 MG tablet  02/26/18   [provider]  mupirocin ointment (BACTROBAN) 2 % Apply 1 application topically 2 (two) times daily. 12/01/14   Chipper Herb, MD  nystatin ointment (MYCOSTATIN)  04/23/18   [provider]    Family History Family History  Problem Relation Age of Onset  . Cancer Father   . Heart disease Father     Social History Social History   Tobacco  Use  . Smoking status: Never Smoker  . Smokeless tobacco: Never Used  Substance Use Topics  . Alcohol use: No  . Drug use: No     Allergies   Codeine, Meperidine, and Penicillins   Review of Systems Review of Systems  Constitutional: Positive for fever. Negative for chills.  HENT: Negative for sore throat.   Eyes: Negative for visual disturbance.  Respiratory: Negative for cough and shortness of breath.   Cardiovascular: Negative for chest pain.  Gastrointestinal: Positive for abdominal pain, diarrhea and  nausea. Negative for constipation and vomiting.  Genitourinary: Negative for dysuria, vaginal bleeding and vaginal discharge.  Musculoskeletal: Negative for arthralgias.  Neurological: Negative for speech difficulty.  Psychiatric/Behavioral: Negative for agitation.  All other systems reviewed and are negative.    Physical Exam Updated Vital Signs BP 132/89 (BP Location: Right Arm) Comment: Simultaneous filing. User may not have seen previous data.  Pulse (!) 131 Comment: Simultaneous filing. User may not have seen previous data.  Temp 99.3 F (37.4 C) (Oral)   Resp 15 Comment: Simultaneous filing. User may not have seen previous data.  Ht 5\' 5"  (1.651 m)   Wt 96.8 kg   SpO2 96% Comment: Simultaneous filing. User may not have seen previous data.  BMI 35.49 kg/m   Physical Exam Vitals signs and nursing note reviewed.  Constitutional:      General: She is not in acute distress. HENT:     Head: Normocephalic and atraumatic.     Nose: Nose normal.  Eyes:     Conjunctiva/sclera: Conjunctivae normal.     Pupils: Pupils are equal, round, and reactive to light.  Neck:     Musculoskeletal: Normal range of motion and neck supple.  Cardiovascular:     Rate and Rhythm: Regular rhythm. Tachycardia present.     Pulses: Normal pulses.     Heart sounds: Normal heart sounds.  Pulmonary:     Effort: Pulmonary effort is normal.     Breath sounds: Normal breath sounds.  Abdominal:     General: Abdomen is flat. Bowel sounds are normal.     Tenderness: There is abdominal tenderness.     Hernia: No hernia is present.  Musculoskeletal: Normal range of motion.  Skin:    General: Skin is warm and dry.     Capillary Refill: Capillary refill takes less than 2 seconds.  Neurological:     General: No focal deficit present.     Mental Status: She is alert and oriented to person, place, and time.  Psychiatric:        Mood and Affect: Mood normal.        Behavior: Behavior normal.      ED  Treatments / Results  Labs (all labs ordered are listed, but only abnormal results are displayed) Labs Reviewed  URINALYSIS, ROUTINE W REFLEX MICROSCOPIC  CBC WITH DIFFERENTIAL/PLATELET  COMPREHENSIVE METABOLIC PANEL  TROPONIN I (HIGH SENSITIVITY)    EKG EKG Interpretation  Date/Time:  Sunday August 25 2018 07:25:42 EDT Ventricular Rate:  134 PR Interval:    QRS Duration: 134 QT Interval:  326 QTC Calculation: 487 R Axis:   -27 Text Interpretation:  Sinus tachycardia Left bundle branch block Confirmed by Burlie Cajamarca ([id number]) on 08/25/2018 7:29:48 AM   Radiology No results found.  Procedures Procedures (including critical care time)  Medications Ordered in ED Medications  fentaNYL (SUBLIMAZE) injection 100 mcg (has no administration in time range)  ondansetron (ZOFRAN) injection 4 mg (4 mg Intravenous Given  08/25/18 0751)  0.9 %  sodium chloride infusion ( Intravenous New Bag/Given 08/25/18 0750)       Final Clinical Impressions(s) / ED Diagnoses      Airel Magadan, MD 08/29/18 2313

## 2018-08-25 NOTE — ED Provider Notes (Signed)
Blood pressure 127/82, pulse (!) 103, temperature 99 F (37.2 C), temperature source Oral, resp. rate 18, height 5\' 5"  (1.651 m), weight 96.8 kg, SpO2 100 %.  In short, Alicia Case is a 53 y.o. female with a chief complaint of Abdominal Pain .  Refer to the original H&P for additional details.  Patient arrives from [facility] with acute appendicitis. Abx given. COVID negative. Surgery at bedside for evaluation.     Margette Fast, MD 08/25/18 [phone]

## 2018-08-25 NOTE — ED Notes (Signed)
Report given to Calvin RN with [facility] °

## 2018-08-25 NOTE — H&P (Signed)
Re:   Alicia Case DOB:   1965-04-20 MRN:   [id number]  Chief Complaint Abdominal pain  ASSESEMENT AND PLAN: 1.  Acute appendicitis  I discussed with the patient the indications and risks of appendiceal surgery.  The primary risks of appendiceal surgery include, but are not limited to, bleeding, infection, bowel surgery, and open surgery.  There is also the risk that the patient may have continued symptoms after surgery.  We discussed the typical post-operative recovery course. I tried to answer the patient's questions.  2.  Recent left foot bunionectomy - 08/13/2018 - [facility] 3.  HTN 4.  History of diverticulosis  Chief Complaint  Patient presents with   Abdominal Pain   PHYSICIAN REQUESTING CONSULTATION:  Dr. April [place]  HISTORY OF PRESENT ILLNESS: Alicia Case is a 53 y.o. (DOB: 06-18-1965)  white female whose primary care physician is Hemberg, Karie Schwalbe, NP Jule Ser, [place]).  Her PCP was Dr. Barnet Glasgow.   She developed abdominal pain Friday night, 08/23/2018, after eating at [employer]  The pain got worse yesterday and she was brought to [place] today.    She came to [place] today with continue abdominal pain.  She has no history of stomach, liver, pancreas or colon disease.  She had a colonoscopy in 2019 in [place] (? Physician).  Her only prior abdominal surgery was a hysterectomy.  CT scan of abdomen - 08/25/2018 - 1. Findings, as above, compatible with acute appendicitis. No periappendiceal abscess or frank perforation identified at thistime.   2. 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 If of clinical concern, this could be definitively evaluated with nonemergent MRI of the abdomen with and without IV gadolinium.    Past Medical History:  Diagnosis Date   Diverticulosis    FHx: migraine headaches    High  cholesterol    Hypertension       Past Surgical History:  Procedure Laterality Date   ABDOMINAL HYSTERECTOMY     FOOT SURGERY Left       Current Facility-Administered Medications  Medication Dose Route Frequency Provider Last Rate Last Dose   metroNIDAZOLE (FLAGYL) IVPB 500 mg  500 mg Intravenous Once Palumbo, April, MD       Current Outpatient Medications  Medication Sig Dispense Refill   cetirizine (ZYRTEC) 5 MG tablet Take 5 mg by mouth daily.     hydrochlorothiazide (HYDRODIURIL) 25 MG tablet Take 25 mg by mouth.     propranolol ER (INDERAL LA) 60 MG 24 hr capsule TAKE 1 CAPSULE BY MOUTH EVERY DAY     simvastatin (ZOCOR) 20 MG tablet TAKE 1 TABLET BY MOUTH EVERYDAY AT BEDTIME     SUMAtriptan (IMITREX) 100 MG tablet      calcium-vitamin D (OSCAL WITH D) 250-125 MG-UNIT tablet Take 1 tablet by mouth.     citalopram (CELEXA) 20 MG tablet Take 20 mg by mouth.     fluconazole (DIFLUCAN) 150 MG tablet      levofloxacin (LEVAQUIN) 500 MG tablet      mupirocin ointment (BACTROBAN) 2 % Apply 1 application topically 2 (two) times daily. 22 g 0   nystatin ointment (MYCOSTATIN)         Allergies  Allergen Reactions   Codeine Other (See Comments)   Meperidine Other (See Comments)   Penicillins     REVIEW OF SYSTEMS: Skin:  No history of rash.  No history of abnormal moles. Infection:  No history of hepatitis or HIV.  No history of MRSA. Neurologic:  No history of stroke.  No history of seizure.  No history of headaches. Cardiac:  HTN.  Seen in about 2008 for rapid heart rate, but on no meds and follow up Pulmonary:  Does not smoke cigarettes.  No asthma or bronchitis.  No OSA/CPAP.  Endocrine:  No diabetes. No thyroid disease. Gastrointestinal:  See HPI Urologic:  No history of kidney stones.  No history of bladder infections. Musculoskeletal:  No history of joint or back disease. Hematologic:  No bleeding disorder.  No history of anemia.  Not  anticoagulated. Psycho-social:  The patient is oriented.   The patient has no obvious psychologic or social impairment to understanding our conversation and plan.  SOCIAL and FAMILY HISTORY: Divorced. Has one son, 24 yo. 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  She works for the [employer].  Her sister, Lucretia Roers [phone]) is her contact.  PHYSICAL EXAM: BP 127/82 (BP Location: Left Arm)    Pulse (!) 103    Temp 99 F (37.2 C) (Oral)    Resp 18    Ht 5\' 5"  (1.651 m)    Wt 96.8 kg    SpO2 100%    BMI 35.49 kg/m   General: Obese WF who is alert and generally healthy appearing.  Skin:  Inspection and palpation - no mass or rash. Eyes:  Conjunctiva and lids unremarkable.            Pupils are equal Ears, Nose, Mouth, and Throat:  Ears and nose unremarkable            Lips and teeth are unremarable. Neck: Supple. No mass, trachea midline.  No thyroid mass. Lymph Nodes:  No supraclavicular, cervical, or inguinal nodes. Lungs: Normal respiratory effort.  Clear to auscultation and symmetric breath sounds. Heart:  Palpation of the heart is normal.            Auscultation: RRR. No murmur or rub.  Abdomen: Soft. No mass. No hernia.  Decreased bowel sounds.  Pfannenstiel incision. RLQ tenderness. Rectal: Not done. Musculoskeletal:  Good muscle strength and ROM  in upper and lower extremities.  Neurologic:  Grossly intact to motor and sensory function. Psychiatric: Normal judgement and insight. Behavior is normal.            Oriented to time, person, place.   DATA REVIEWED, COUNSELING AND COORDINATION OF CARE: Epic notes reviewed. Counseling and coordination of care exceeded more than 50% of the time spent with patient. Total time spent with patient and charting: 45 minutes   Alphonsa Overall, MD,  [facility], [address]  [place], [place]    [place] Phone:  [phone] FAX:  [phone]

## 2018-08-25 NOTE — ED Notes (Signed)
ED Provider at bedside. 

## 2018-08-25 NOTE — Anesthesia Preprocedure Evaluation (Addendum)
Anesthesia Evaluation  Patient identified by MRN, date of birth, ID band Patient awake    Reviewed: Allergy & Precautions, H&P , NPO status , Patient's Chart, lab work & pertinent test results  Airway Mallampati: II  TM Distance: >3 FB Neck ROM: Full    Dental no notable dental hx. (+) Teeth Intact   Pulmonary neg pulmonary ROS,    Pulmonary exam normal breath sounds clear to auscultation       Cardiovascular Exercise Tolerance: Good hypertension, Pt. on medications Normal cardiovascular exam Rhythm:Regular Rate:Normal     Neuro/Psych  Headaches, Anxiety    GI/Hepatic negative GI ROS, Neg liver ROS,   Endo/Other  negative endocrine ROS  Renal/GU   negative genitourinary   Musculoskeletal   Abdominal (+) + obese,   Peds  Hematology   Anesthesia Other Findings   Reproductive/Obstetrics                            Lab Results  Component Value Date   WBC 16.7 (H) 08/25/2018   HGB 13.4 08/25/2018   HCT 40.7 08/25/2018   MCV 90.4 08/25/2018   PLT 311 08/25/2018   Lab Results  Component Value Date   CREATININE 0.84 08/25/2018   BUN 10 08/25/2018   NA 137 08/25/2018   K 3.3 (L) 08/25/2018   CL 99 08/25/2018   CO2 23 08/25/2018     Anesthesia Physical Anesthesia Plan  ASA: II and emergent  Anesthesia Plan: General   Post-op Pain Management:    Induction: Intravenous, Rapid sequence and Cricoid pressure planned  PONV Risk Score and Plan: Treatment may vary due to age or medical condition, Dexamethasone, Promethazine and Midazolam  Airway Management Planned: Oral ETT  Additional Equipment:   Intra-op Plan:   Post-operative Plan: Post-operative intubation/ventilation  Informed Consent: I have reviewed the patients History and Physical, chart, labs and discussed the procedure including the risks, benefits and alternatives for the proposed anesthesia with the patient or  authorized representative who has indicated his/her understanding and acceptance.     Dental advisory given  Plan Discussed with: CRNA  Anesthesia Plan Comments:        Anesthesia Quick Evaluation

## 2018-08-25 NOTE — ED Notes (Signed)
Report given to Charlean Sanfilippo, charge nurse at [facility] ED

## 2018-08-25 NOTE — Anesthesia Postprocedure Evaluation (Signed)
Anesthesia Post Note  Patient: Alicia Case  Procedure(s) Performed: APPENDECTOMY LAPAROSCOPIC (N/A Abdomen)     Patient location during evaluation: PACU Anesthesia Type: General Level of consciousness: awake and alert Pain management: pain level controlled Vital Signs Assessment: post-procedure vital signs reviewed and stable Respiratory status: spontaneous breathing, nonlabored ventilation, respiratory function stable and patient connected to nasal cannula oxygen Cardiovascular status: blood pressure returned to baseline and stable Postop Assessment: no apparent nausea or vomiting Anesthetic complications: no    Last Vitals:  Vitals:   08/25/18 1136 08/25/18 1410  BP: 127/82   Pulse: (!) 103   Resp: 18   Temp: 37.2 C (P) 36.9 C  SpO2: 100%     Last Pain:  Vitals:   08/25/18 1410  TempSrc:   PainSc: (P) Asleep                 Barnet Glasgow

## 2018-08-26 ENCOUNTER — Encounter (HOSPITAL_COMMUNITY): Payer: Self-pay | Admitting: Surgery

## 2018-08-26 MED ORDER — HYDROCODONE-ACETAMINOPHEN 5-325 MG PO TABS: 1.0000 | ORAL_TABLET | Freq: Four times a day (QID) | ORAL | 0 refills | Status: DC | PRN

## 2018-08-26 MED ORDER — PROMETHAZINE HCL 25 MG/ML IJ SOLN
12.5000 mg | Freq: Once | INTRAMUSCULAR | Status: AC
  Administered 2018-08-26: 12.5 mg via INTRAVENOUS
  Filled 2018-08-26: qty 1

## 2018-08-26 MED ORDER — MORPHINE SULFATE (PF) 2 MG/ML IV SOLN
1.0000 mg | INTRAVENOUS | Status: DC | PRN
  Administered 2018-08-26: 22:00:00 2 mg via INTRAVENOUS
  Filled 2018-08-26 (×2): qty 1

## 2018-08-26 MED ORDER — SUMATRIPTAN SUCCINATE 50 MG PO TABS
50.0000 mg | ORAL_TABLET | ORAL | Status: DC | PRN
  Administered 2018-08-26 – 2018-08-27 (×2): 50 mg via ORAL
  Filled 2018-08-26 (×4): qty 1

## 2018-08-26 NOTE — Discharge Summary (Signed)
[facility]/Trauma Discharge Summary   Patient ID: Alicia Case MRN: [id number] DOB/AGE: Mar 13, 1965 53 y.o.  Admit date: 08/25/2018 Discharge date: 08/26/2018  Admitting Diagnosis: Appendicitis  Discharge Diagnosis Patient Active Problem List   Diagnosis Date Noted  . Acute appendicitis 08/25/2018  . Tinea corporis 04/23/2018  . Skin lesion of left leg 03/07/2018  . Conductive hearing loss of left ear 05/15/2017  . Encounter for screening colonoscopy 04/26/2017  . Seasonal allergies 04/26/2017  . Obesity (BMI 30-39.9) 10/23/2016  . Acute maxillary sinusitis 10/21/2015  . Bilateral impacted cerumen 10/21/2015  . Dyslipidemia 08/31/2015  . Anxiety, generalized 07/13/2014  . Need for vaccination 07/13/2014  . Vitamin D deficiency 09/18/2013  . Essential hypertension 09/04/2013  . Intractable migraine without aura and without status migrainosus 09/04/2013  . Major depressive disorder, recurrent episode, moderate (Alpha) 09/04/2013    Consultants None  Imaging: Ct Abdomen Pelvis W Contrast  Result Date: 08/25/2018 CLINICAL DATA:  53 year old female with history of lower abdominal pain and fever. EXAM: CT ABDOMEN AND PELVIS WITH CONTRAST TECHNIQUE: Multidetector CT imaging of the abdomen and pelvis was performed using the standard protocol following bolus administration of intravenous contrast. CONTRAST:  165mL OMNIPAQUE IOHEXOL 300 MG/ML  SOLN COMPARISON:  No priors. FINDINGS: Lower chest: Scattered areas of linear scarring in the lung bases bilaterally. 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 No other definite suspicious cystic or solid hepatic lesions. No intra or extrahepatic biliary ductal dilatation. Gallbladder is  normal in appearance. Pancreas: No pancreatic mass. No pancreatic ductal dilatation. No pancreatic or peripancreatic fluid collections or inflammatory changes. Spleen: Unremarkable. Adrenals/Urinary Tract: Subcentimeter low-attenuation lesion in the lower pole of the left kidney, too small to characterize, but statistically likely to represent a cyst. Right kidney and bilateral adrenal glands are normal in appearance. No hydroureteronephrosis. Urinary bladder is normal in appearance. Stomach/Bowel: Normal appearance of the stomach. No pathologic dilatation of small bowel or colon. Appendix is retrocecal in location extending leftward toward the midline posterior to several loops of small bowel, and is dilated measuring up to 1 cm in the tip. Extensive surrounding inflammatory changes and trace volume of periappendiceal fluid, compatible with an acute appendicitis. No appendicoliths identified. Vascular/Lymphatic: No significant atherosclerotic disease, aneurysm or dissection noted in the abdominal or pelvic vasculature. No lymphadenopathy noted in the abdomen or pelvis. Reproductive: Status post hysterectomy. Ovaries are not confidently identified may be surgically absent or atrophic. Other: No significant volume of ascites.  No pneumoperitoneum. Musculoskeletal: There are no aggressive appearing lytic or blastic lesions noted in the visualized portions of the skeleton. IMPRESSION: 1. Findings, as above, compatible with acute appendicitis. No periappendiceal abscess or frank perforation identified at this time. Emergent surgical consultation is strongly recommended. 2. 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 If of clinical concern, this could be definitively evaluated with nonemergent MRI of the abdomen with and without IV gadolinium. 3. Additional incidental findings, as above. Critical Value/emergent results were  called by telephone at the time of interpretation on 08/25/2018 at 9:26 am to Dr. Veatrice Kells, who verbally acknowledged these results. Electronically Signed   By: Vinnie Langton M.D.   On: 08/25/2018 09:29    Procedures  Dr. Lucia Gaskins (08/25/18) - Laparoscopic Appendectomy  HPI: Alicia Case is a 53 y.o. (DOB: 1965/09/13)  white female whose primary care physician is Hemberg, Karie Schwalbe, NP [facility], [place]).  Her PCP was Dr. Barnet Glasgow.              She developed abdominal pain Friday night, 08/23/2018, after eating at [employer].  The pain got worse yesterday and she was brought to [place] today.             She came to [place] today with continue abdominal pain.             She has no history of stomach, liver, pancreas or colon disease.  She had a colonoscopy in 2019 in [place] (? Physician).  Her only prior abdominal surgery was a hysterectomy.  CT scan of abdomen - 08/25/2018 - 1. Findings, as above, compatible with acute appendicitis. No periappendiceal abscess or frank perforation identified at thistime.              2. 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 If of clinical concern, this could be definitively evaluated with nonemergent MRI of the abdomen with and without IV gadolinium.   Hospital Course:  Workup showed appendicitis.  Patient was admitted and underwent procedure listed above.  Tolerated procedure well and was transferred to the floor.  Diet was advanced as tolerated.  On POD#1, the patient was voiding well, tolerating diet, ambulating well, pain well controlled, vital signs stable, incisions c/d/i and felt stable for discharge home.  Discussed incidental liver finding on CT scan.  Instructed patient to follow with PCP.  She expressed understanding.  Patient will follow up as outlined below and knows to call with questions or concerns.      Patient was discharged in good condition.  The [place] Substance controlled database was reviewed prior to prescribing narcotic pain medication to this patient.  Physical Exam: General:  Alert, NAD, pleasant, cooperative Cardio: RRR, S1 & S2 normal, no murmur, rubs, gallops Resp: Effort normal, lungs CTA bilaterally, no wheezes, rales, rhonchi Abd:  Soft, ND, normal bowel sounds, incisions with glue intact appears well and are without drainage, bleeding, or signs of infection.  Minimal generalized tenderness to palpation without guarding.  No peritonitis Skin: warm and dry, no rashes noted  Allergies as of 08/26/2018      Reactions   Codeine Other (See Comments)   Headache   Meperidine Nausea Only   Penicillins    Did it involve swelling of the face/tongue/throat, SOB, or low BP? Unknown Did it involve sudden or severe rash/hives, skin peeling, or any reaction on the inside of your mouth or nose? Unknown Did you need to seek medical attention at a hospital or doctor's office? Unknown When did it last happen?Childhood If all above answers are "NO", may proceed with cephalosporin use.      Medication List    TAKE these medications   hydrochlorothiazide 25 MG tablet Commonly known as: HYDRODIURIL Take 25 mg by mouth.   HYDROcodone-acetaminophen 5-325 MG tablet Commonly known as: NORCO/VICODIN Take 1-2 tablets by mouth every 6 (six) hours as needed for moderate pain.   mupirocin ointment 2 % Commonly known as: BACTROBAN Apply 1 application topically 2 (two) times daily.   propranolol ER 60 MG 24 hr capsule Commonly known as: INDERAL LA TAKE 1 CAPSULE BY  MOUTH EVERY DAY   simvastatin 20 MG tablet Commonly known as: ZOCOR Take 20 mg by mouth daily at 6 PM.   SUMAtriptan 100 MG tablet Commonly known as: IMITREX Take 100 mg by mouth every 2 (two) hours as needed for migraine.        Follow-up [facility], [place]. Go on 09/12/2018.    Specialty: General Surgery Why: at 1:30pm.  Please arrive 30 minutes prior to complete paperwork.  Please bring photo ID and insurance card.  Contact information: [address] [place] [phone]          Signed: Fraser Din [facility] 08/26/2018, 8:58 AM Pager: [phone] Consults: [phone] Mon-Fri 7:00 am-4:30 pm Sat-Sun 7:00 am-11:30 am

## 2018-08-26 NOTE — Discharge Instructions (Signed)

## 2018-08-26 NOTE — Progress Notes (Signed)
Pt vomited today prior to discharge. She believes it was due to the lovenox injection. She had a migraine this am and after the lovenox she got very flushed. She then got nauseated and vomited. She is feeling better now but still somewhat flushed. She will not be discharged today and will be re-evaulated for discharge tomorrow.   Jackson Latino, [facility] Pager [phone]

## 2018-08-27 NOTE — Progress Notes (Signed)
Discharge instructions discussed with patient, verbalized agreement and understanding, 

## 2018-08-27 NOTE — Discharge Summary (Signed)
Patient ID: Alicia Case [id number] November 12, 1965 53 y.o.  Admit date: 08/25/2018 Discharge date: 08/27/2018  Admitting Diagnosis: Appendicitis  Discharge Diagnosis Patient Active Problem List   Diagnosis Date Noted  . Acute appendicitis 08/25/2018  . Tinea corporis 04/23/2018  . Skin lesion of left leg 03/07/2018  . Conductive hearing loss of left ear 05/15/2017  . Encounter for screening colonoscopy 04/26/2017  . Seasonal allergies 04/26/2017  . Obesity (BMI 30-39.9) 10/23/2016  . Acute maxillary sinusitis 10/21/2015  . Bilateral impacted cerumen 10/21/2015  . Dyslipidemia 08/31/2015  . Anxiety, generalized 07/13/2014  . Need for vaccination 07/13/2014  . Vitamin D deficiency 09/18/2013  . Essential hypertension 09/04/2013  . Intractable migraine without aura and without status migrainosus 09/04/2013  . Major depressive disorder, recurrent episode, moderate (Alicia Case) 09/04/2013    Consultants None  Reason for Admission: Alicia Johnsonis a 53 y.o.(DOB: Sep 19, 1965)whitefemalewhose primary care physician is Hemberg, Alicia Schwalbe, NP([place], [facility]). Her PCP was Dr. Barnet Case.  She developed abdominal pain Friday night, 08/23/2018, after eating at [employer].The pain got worse yesterday and she was brought to [place] today. She came to [place] today with continue abdominal pain. She has no history of stomach, liver, pancreas or colon disease. She had a colonoscopy in 2019 in [place] (? Physician). Her only prior abdominal surgery was a hysterectomy.  CT scan of abdomen - 08/25/2018 -1. Findings, as above, compatible with acute appendicitis. No periappendiceal abscess or frank perforation identified at thistime.  2. 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 If of clinical concern, this could  be definitively evaluated with nonemergent MRI of the abdomen with and without IV gadolinium.  Procedures Dr. Lucia Gaskins (08/25/18) - Laparoscopic Appendectomy  Hospital Course:  Workup showed appendicitis.  Patient was admitted and underwent procedure listed above.  Tolerated procedure well and was transferred to the floor.  Diet was advanced as tolerated.  On POD#1, the patient was voiding well, tolerating diet, ambulating well, pain well controlled, vital signs stable, incisions c/d/i and felt stable for discharge home.      Prior to d/c on 8/10 patient had reaction to what was believed to be Lovenox. She became very flushed then got nauseated and vomited. Patient denied any facial swelling, lip swelling, tongue swelling, difficulty swallowing, SOB. Patients symptoms resolved and she was watch overnight. On POD 2, the patient was voiding well, tolerating diet, ambulating well, pain well controlled, vital signs stable, incisions c/d/i and felt stable for discharge home. Lovenox was added to her allergy list. We discussed incidental liver finding on CT scan.  Instructed patient to follow with PCP.  She expressed understanding.  Patient will follow up as outlined below and knows to call with questions or concerns. A note was provided for work.   Patient was discharged in good condition.  The [place] Substance controlled database was reviewed prior to prescribing narcotic pain medication to this patient.    Physical Exam: Gen:  Alert, NAD, pleasant HEENT: No facial swelling. Heart: Regular  Pulm:  Lungs CTA b/l. Normal rate and effort  Abd: Soft, ND, appropriately tender around incisions. +BS incisions with glue intact appears well and are without drainage, bleeding, or signs of infection.  Ext:  No edema Psych: A&Ox3  Skin: no rashes noted, warm and dry  Allergies as of 08/27/2018      Reactions   Codeine Other (  See Comments)   Headache   Meperidine Nausea Only   Lovenox [enoxaparin  Sodium] Nausea And Vomiting   ? Reaction on 8/10. Headache, flushing, nausea, and vomiting.    Penicillins    Did it involve swelling of the face/tongue/throat, SOB, or low BP? Unknown Did it involve sudden or severe rash/hives, skin peeling, or any reaction on the inside of your mouth or nose? Unknown Did you need to seek medical attention at a hospital or doctor's office? Unknown When did it last happen?Childhood If all above answers are "NO", may proceed with cephalosporin use.      Medication List    TAKE these medications   hydrochlorothiazide 25 MG tablet Commonly known as: HYDRODIURIL Take 25 mg by mouth.   HYDROcodone-acetaminophen 5-325 MG tablet Commonly known as: NORCO/VICODIN Take 1-2 tablets by mouth every 6 (six) hours as needed for moderate pain.   mupirocin ointment 2 % Commonly known as: BACTROBAN Apply 1 application topically 2 (two) times daily.   propranolol ER 60 MG 24 hr capsule Commonly known as: INDERAL LA TAKE 1 CAPSULE BY MOUTH EVERY DAY   simvastatin 20 MG tablet Commonly known as: ZOCOR Take 20 mg by mouth daily at 6 PM.   SUMAtriptan 100 MG tablet Commonly known as: IMITREX Take 100 mg by mouth every 2 (two) hours as needed for migraine.        Follow-up [facility], [place]. Go on 09/12/2018.   Specialty: General Surgery Why: at 1:30pm.  Please arrive 30 minutes prior to complete paperwork.  Please bring photo ID and insurance card.  Contact information: [place], [place], NP. Schedule an appointment as soon as possible for a visit in 2 week(s).   Specialty: Adult Health Nurse Practitioner Why: to discuss liver lesion seen on CT Contact information: [address] [place] [address] [phone]           Signed: Alferd Apa, [facility] 08/27/2018, 8:11 AM Pager:  [phone]

## 2018-09-11 ENCOUNTER — Other Ambulatory Visit: Payer: BC Managed Care – PPO

## 2018-09-12 ENCOUNTER — Ambulatory Visit (INDEPENDENT_AMBULATORY_CARE_PROVIDER_SITE_OTHER): Payer: Self-pay | Admitting: Podiatry

## 2018-09-12 ENCOUNTER — Encounter: Payer: Self-pay | Admitting: Podiatry

## 2018-09-12 ENCOUNTER — Other Ambulatory Visit: Payer: Self-pay

## 2018-09-12 ENCOUNTER — Ambulatory Visit (INDEPENDENT_AMBULATORY_CARE_PROVIDER_SITE_OTHER): Payer: BC Managed Care – PPO

## 2018-09-12 DIAGNOSIS — M7752 Other enthesopathy of left foot: Secondary | ICD-10-CM

## 2018-09-12 DIAGNOSIS — Z09 Encounter for follow-up examination after completed treatment for conditions other than malignant neoplasm: Secondary | ICD-10-CM

## 2018-09-16 ENCOUNTER — Other Ambulatory Visit: Payer: Self-pay | Admitting: Adult Health Nurse Practitioner

## 2018-09-16 ENCOUNTER — Other Ambulatory Visit (HOSPITAL_COMMUNITY): Payer: Self-pay | Admitting: Adult Health Nurse Practitioner

## 2018-09-16 DIAGNOSIS — D1803 Hemangioma of intra-abdominal structures: Secondary | ICD-10-CM

## 2018-09-17 NOTE — Progress Notes (Signed)
Subjective: Alicia Case is a 53 y.o. is seen today in office s/p Left foot McBride preformed on 08/13/2018 with Dr. Paulla Dolly.  He states that she is doing well not taking any pain medication.  She is also interested in possibly having ingrown toenails removed.  They are not causing significant discomfort at this time.  She wants to talk with the procedure.  She may want to hold off because she recently she has had an appendectomy as well that she is still recovering from. Denies any systemic complaints such as fevers, chills, nausea, vomiting. No calf pain, chest pain, shortness of breath.   Objective: General: No acute distress, AAOx3  DP/PT pulses palpable 2/4, CRT < 3 sec to all digits.  Protective sensation intact. Motor function intact.  LEFT foot: Incision is well coapted without any evidence of dehiscence and a scar is formed.  There is no pain with surgical site.  Minimal swelling.  No erythema or warmth.  No pain with MPJ range of motion.  There is mild induration to both medial lateral aspects of hallux toenails.  No edema, erythema, drainage or pus seen.  Minimal discomfort today. No other areas of tenderness to bilateral lower extremities.  No other open lesions or pre-ulcerative lesions.  No pain with calf compression, swelling, warmth, erythema.   Assessment and Plan:  Status post left foot surgery, doing well with no complications; ingrown toenail  -Treatment options discussed including all alternatives, risks, and complications -X-rays were obtained today.  No evidence of acute fracture.  -From surgical standpoint she is doing well.  Discussed continue ice to the area and gradual transition back into regular shoe.  Encourage range of motion exercises. -We discussed partial nail avulsions.  However she recently just had an appendectomy and with the foot surgery she wants to hold off on another procedure at this time.  Monitor for signs or symptoms of infection.  Trula Slade DPM

## 2018-09-26 ENCOUNTER — Ambulatory Visit (HOSPITAL_COMMUNITY)
Admission: RE | Admit: 2018-09-26 | Discharge: 2018-09-26 | Disposition: A | Discharge status: Home / Self Care | Payer: BC Managed Care – PPO | Source: Ambulatory Visit | Attending: Adult Health Nurse Practitioner | Admitting: Adult Health Nurse Practitioner

## 2018-09-26 ENCOUNTER — Other Ambulatory Visit: Payer: Self-pay

## 2018-09-26 DIAGNOSIS — D1803 Hemangioma of intra-abdominal structures: Secondary | ICD-10-CM | POA: Insufficient documentation

## 2018-09-26 MED ORDER — GADOBUTROL 1 MMOL/ML IV SOLN
10.0000 mL | Freq: Once | INTRAVENOUS | Status: AC | PRN
  Administered 2018-09-26: 20:00:00 10 mL via INTRAVENOUS

## 2018-10-07 ENCOUNTER — Encounter: Payer: Self-pay | Admitting: Podiatry

## 2018-10-07 ENCOUNTER — Other Ambulatory Visit: Payer: Self-pay

## 2018-10-07 ENCOUNTER — Ambulatory Visit (INDEPENDENT_AMBULATORY_CARE_PROVIDER_SITE_OTHER): Payer: BC Managed Care – PPO | Admitting: Podiatry

## 2018-10-07 ENCOUNTER — Ambulatory Visit (INDEPENDENT_AMBULATORY_CARE_PROVIDER_SITE_OTHER): Payer: BC Managed Care – PPO

## 2018-10-07 DIAGNOSIS — L600 Ingrowing nail: Secondary | ICD-10-CM

## 2018-10-07 DIAGNOSIS — M7752 Other enthesopathy of left foot: Secondary | ICD-10-CM

## 2018-10-07 MED ORDER — NEOMYCIN-POLYMYXIN-HC 3.5-10000-1 OT SOLN: OTIC | 1 refills | Status: AC

## 2018-10-07 NOTE — Patient Instructions (Signed)

## 2018-10-14 NOTE — Progress Notes (Signed)
Subjective:   Patient ID: Alicia Case, female   DOB: 53 y.o.   MRN: [id number]   HPI Patient presents stating doing well with surgery with minimal discomfort in these ingrown toenails continued to be a problem and she would like to go ahead and get them corrected   ROS      Objective:  Physical Exam  Neurovascular status intact with patient found to have incurvated nailbeds hallux left medial side and the right one on the lateral side that are painful when pressed with no active drainage or redness with good healing of the left foot with bone structure healthy     Assessment:  Ingrown toenail deformity hallux bilateral with pain along with well-healed surgical site left     Plan:  H&P x-ray reviewed and today I went ahead and I recommended correction allowing her to read and then signed consent form understanding risk.  I infiltrated each hallux 60 mg Xylocaine Marcaine mixture I removed the medial border left hallux lateral border right hallux exposed the matrix and applied phenol 3 applications 30 seconds followed by alcohol lavage and sterile dressing.  Gave instructions for soaks and advised this patient on leaving dressing on 24 hours but taken off earlier if it should start to throb and also wrote prescription for drops and encouraged her to call with any questions concerns

## 2019-03-16 ENCOUNTER — Ambulatory Visit: Payer: BC Managed Care – PPO

## 2019-03-23 ENCOUNTER — Ambulatory Visit: Payer: BC Managed Care – PPO | Attending: Internal Medicine

## 2019-03-23 DIAGNOSIS — Z23 Encounter for immunization: Secondary | ICD-10-CM | POA: Insufficient documentation

## 2019-03-23 NOTE — Progress Notes (Signed)
   Covid-19 Vaccination Clinic  Name:  Celes Mossey    MRN: [id number] DOB: 1965-10-24  03/23/2019  Ms. Bacilio was observed post Covid-19 immunization for 15 minutes without incident. She was provided with Vaccine Information Sheet and instruction to access the V-Safe system.   Ms. Brisbane was instructed to call 911 with any severe reactions post vaccine: Marland Kitchen Difficulty breathing  . Swelling of face and throat  . A fast heartbeat  . A bad rash all over body  . Dizziness and weakness   Immunizations Administered    Name Date Dose VIS Date Route   Pfizer COVID-19 Vaccine 03/23/2019 12:01 PM 0.3 mL 12/27/2018 Intramuscular   Manufacturer: [place]   Lot: [id number]   [place]: [id number]

## 2019-04-13 ENCOUNTER — Ambulatory Visit: Payer: Self-pay | Attending: Internal Medicine

## 2019-04-13 DIAGNOSIS — Z23 Encounter for immunization: Secondary | ICD-10-CM

## 2019-04-13 NOTE — Progress Notes (Signed)
   Covid-19 Vaccination Clinic  Name:  Lene Cooke    MRN: [id number] DOB: 24-May-1965  04/13/2019  Ms. Kerschner was observed post Covid-19 immunization for 15 minutes without incident. She was provided with Vaccine Information Sheet and instruction to access the V-Safe system.   Ms. Leanos was instructed to call 911 with any severe reactions post vaccine: Marland Kitchen Difficulty breathing  . Swelling of face and throat  . A fast heartbeat  . A bad rash all over body  . Dizziness and weakness   Immunizations Administered    Name Date Dose VIS Date Route   Pfizer COVID-19 Vaccine 04/13/2019 11:06 AM 0.3 mL 12/27/2018 Intramuscular   Manufacturer: [place]   Lot: [id number]   [place]: [id number]

## 2019-08-14 ENCOUNTER — Other Ambulatory Visit: Payer: Self-pay

## 2019-08-14 ENCOUNTER — Encounter: Payer: Self-pay | Admitting: Podiatry

## 2019-08-14 ENCOUNTER — Ambulatory Visit: Payer: BC Managed Care – PPO | Admitting: Podiatry

## 2019-08-14 DIAGNOSIS — L600 Ingrowing nail: Secondary | ICD-10-CM

## 2019-08-14 NOTE — Patient Instructions (Signed)

## 2019-08-15 NOTE — Progress Notes (Signed)
Subjective:   Patient ID: Alicia Case, female   DOB: 54 y.o.   MRN: [id number]   HPI Patient concerned that there may be a reoccurrence of an ingrown toenail on the right big toe which we have not seen for period of time   ROS      Objective:  Physical Exam  Neurovascular status intact with crusted-like tissue in the right hallux lateral border localized no erythema no drainage associated with it     Assessment:  Possibility for localized scar tissue or scab tissue formation with possibility for small amount of nail growth     Plan:  I went ahead today and using sterile instrumentation I debrided the area.  I discussed that if this were to do the same thing that we could consider removing the schedule but let us see how he responds to this and there was no bleeding and patient will be seen back as symptoms indicate

## 2021-01-20 IMAGING — MR MR ABDOMEN WO/W CM
4 of 7 series · 25 of 48 positions shown · IV contrast (gadavist)
Comparison: CT of 08/25/2018

CLINICAL DATA: CT demonstrating a hypervascular liver lesion.

EXAM:
MRI ABDOMEN WITHOUT AND WITH CONTRAST
TECHNIQUE: Multiplanar multisequence MR imaging of the abdomen was performed
both before and after the administration of intravenous contrast.
CONTRAST:  10mL GADAVIST GADOBUTROL 1 MMOL/ML IV SOLN

[Series 7: T2 fat-sat · axial · 5.0mm · 0.82mm/px · z∈[+17,+272]mm · 6 of 52 slices shown]
[im 1/52]
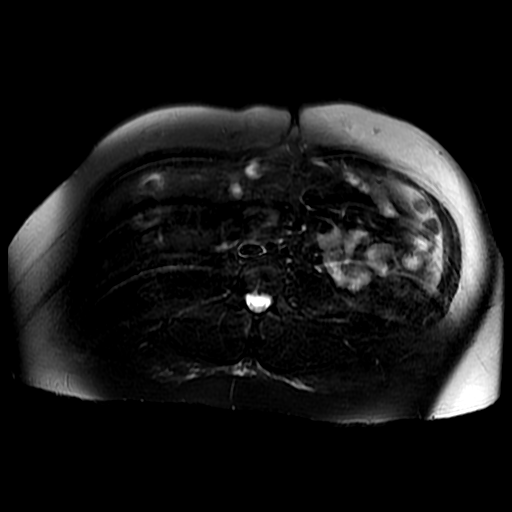
[im 11/52]
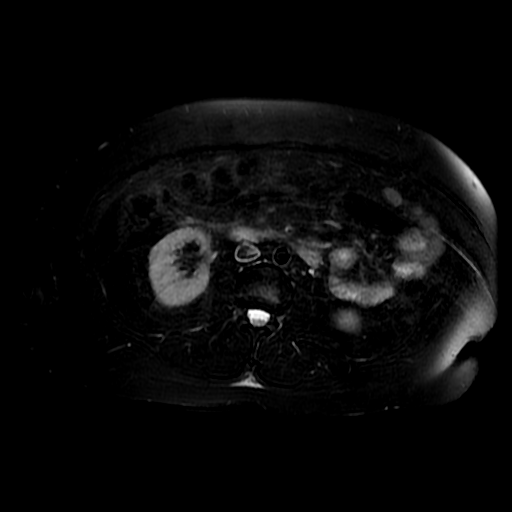
[im 21/52]
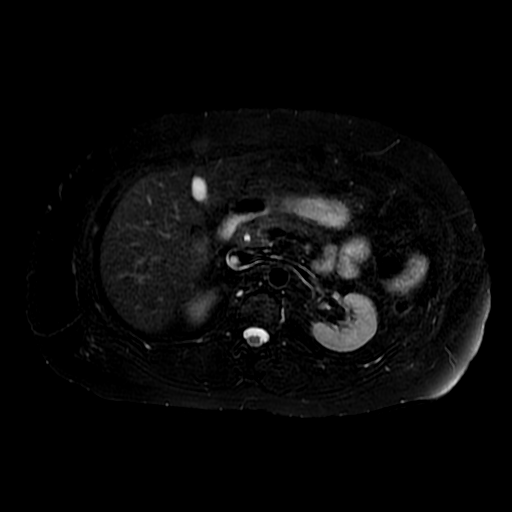
[im 31/52]
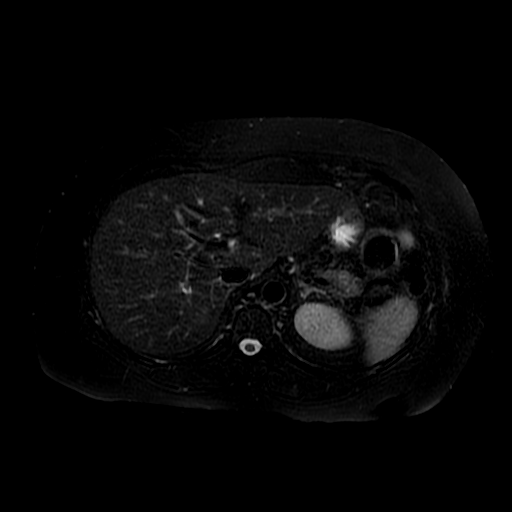
[im 41/52]
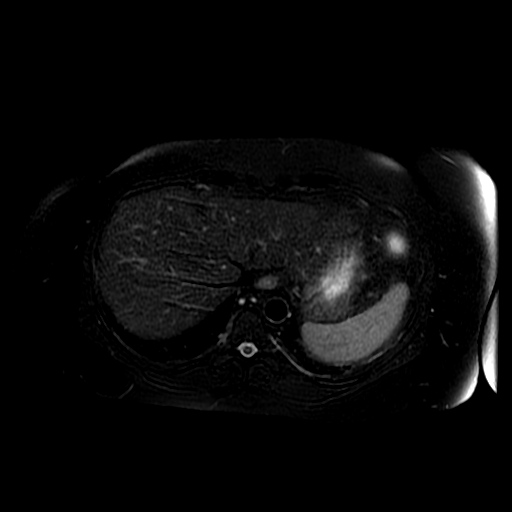
[im 52/52]
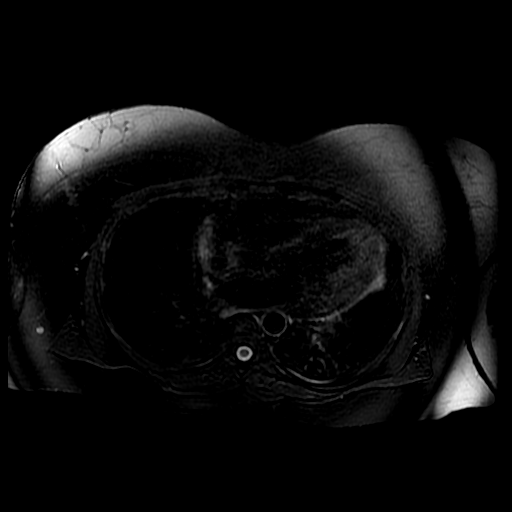

[Series 8: DWI b500 · axial · 6.0mm · 1.48mm/px · z∈[+52,+286]mm · 8 of 62 slices shown]
[im 1/62]
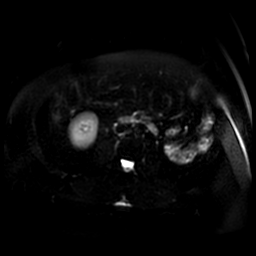
[im 9/62]
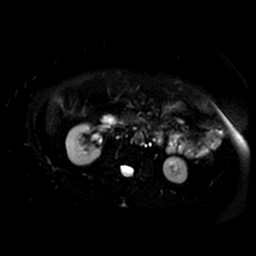
[im 18/62]
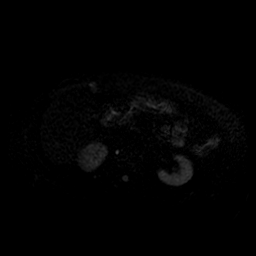
[im 27/62]
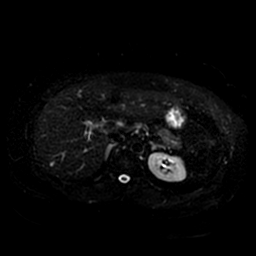
[im 35/62]
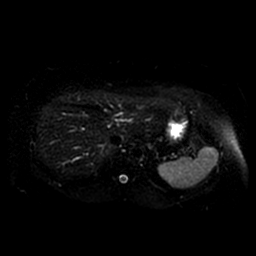
[im 44/62]
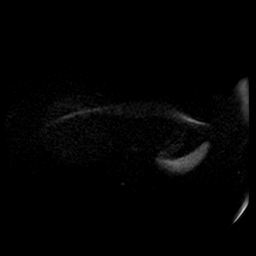
[im 53/62]
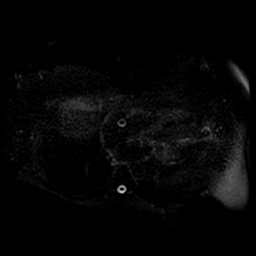
[im 62/62]
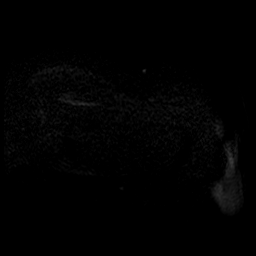

[Series 9: T2 · axial · 5.0mm · 0.82mm/px · z∈[+29,+284]mm · 6 of 52 slices shown (1 of 2)]
[im 1/52]
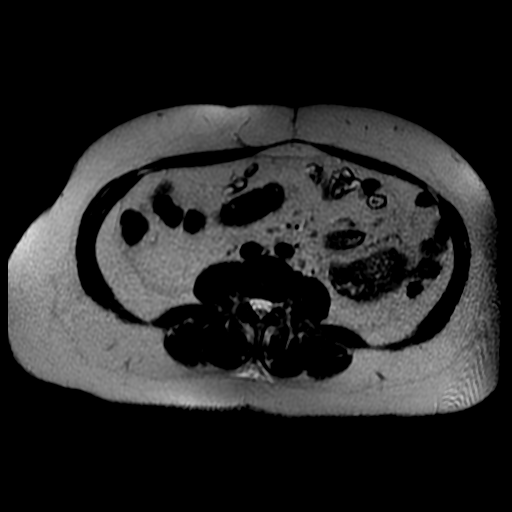
[im 11/52]
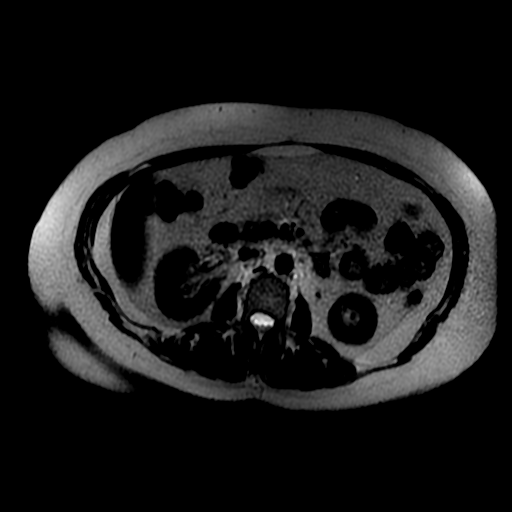
[im 21/52]
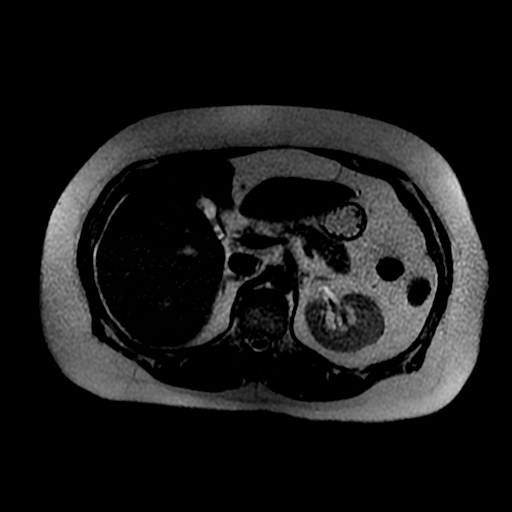
[im 31/52]
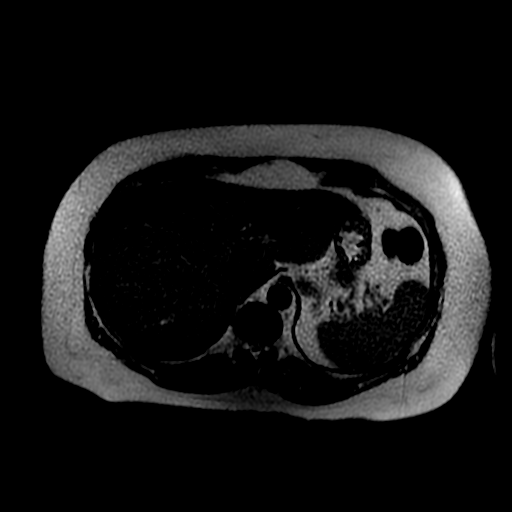
[im 41/52]
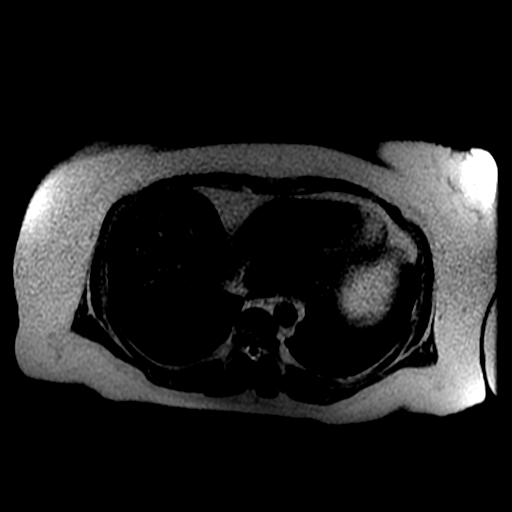
[im 52/52]
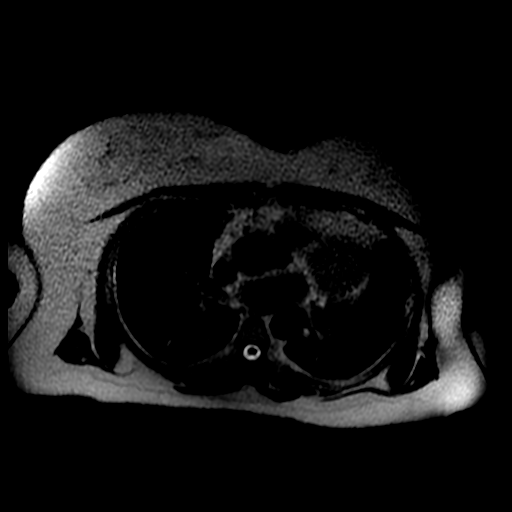

[Series 10: T2 · coronal · 5.0mm · 0.78mm/px · 5 of 45 slices shown (2 of 2)]
[im 1/45]
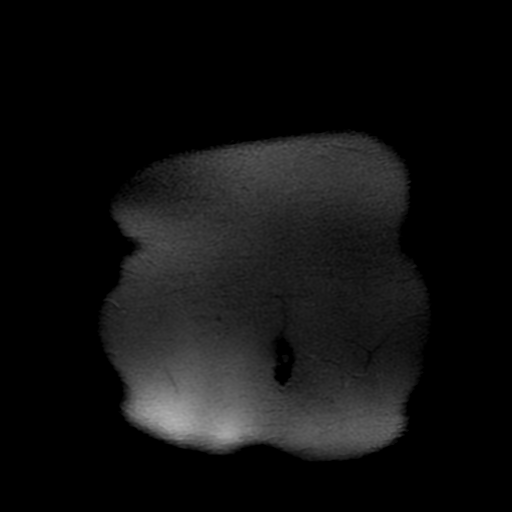
[im 9/45]
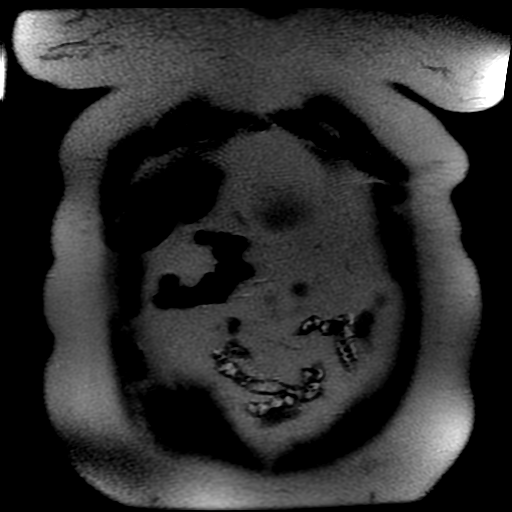
[im 18/45]
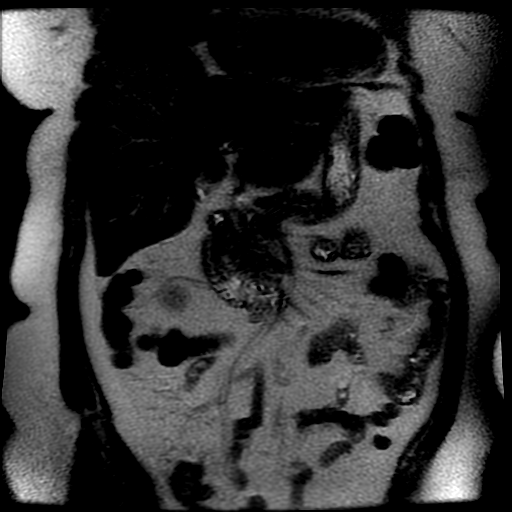
[im 27/45]
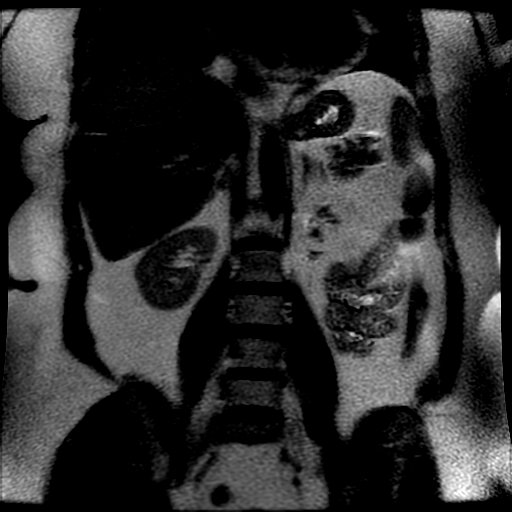
[im 45/45]
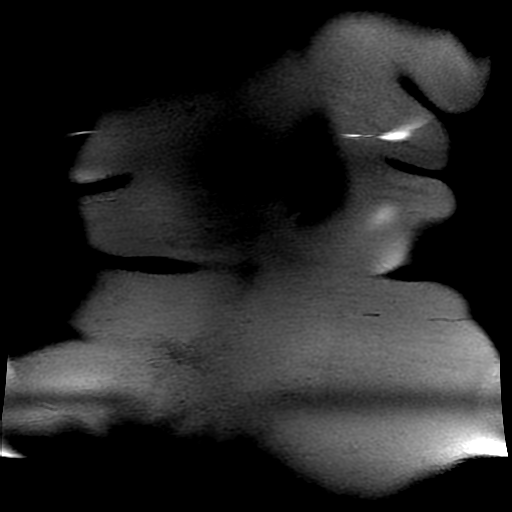

[25 of 48 positions shown; findings below may reference images not displayed]

FINDINGS: Lower chest: Normal heart size without pericardial or pleural
effusion. Lingular scarring

Hepatobiliary: Moderate hepatic steatosis. No cirrhosis. 7 mm T2
hyperintense lesion within segment 6 demonstrates T2 hyperintensity,
early and delayed post-contrast enhancement, consistent with a
hemangioma. No suspicious liver lesion. Normal gallbladder, without
biliary ductal dilatation.

Pancreas:  Normal, without mass or ductal dilatation.

Spleen:  Normal in size, without focal abnormality.

Adrenals/Urinary Tract: Normal adrenal glands. Normal kidneys,
without hydronephrosis.

Stomach/Bowel: Normal stomach and abdominal bowel loops.

Vascular/Lymphatic: Normal caliber of the aorta and branch vessels.
No abdominal adenopathy.

Other:  No ascites.

Musculoskeletal: No acute osseous abnormality.
IMPRESSION: 1. Right hepatic lobe 7 mm hemangioma.
2. Hepatic steatosis.
